# Patient Record
Sex: Female | Born: 1955 | Race: White | Hispanic: No | Marital: Married | Smoking: Never smoker
Health system: Southern US, Community
[De-identification: ages and names within clinical notes are randomized; demographics above are authoritative.]

## PROBLEM LIST (undated history)

## (undated) DIAGNOSIS — F0390 Unspecified dementia without behavioral disturbance: Secondary | ICD-10-CM

## (undated) DIAGNOSIS — C801 Malignant (primary) neoplasm, unspecified: Secondary | ICD-10-CM

## (undated) DIAGNOSIS — R519 Headache, unspecified: Secondary | ICD-10-CM

## (undated) DIAGNOSIS — F329 Major depressive disorder, single episode, unspecified: Secondary | ICD-10-CM

## (undated) DIAGNOSIS — F419 Anxiety disorder, unspecified: Secondary | ICD-10-CM

## (undated) DIAGNOSIS — F32A Depression, unspecified: Secondary | ICD-10-CM

## (undated) DIAGNOSIS — K219 Gastro-esophageal reflux disease without esophagitis: Secondary | ICD-10-CM

## (undated) DIAGNOSIS — R51 Headache: Secondary | ICD-10-CM

## (undated) DIAGNOSIS — F319 Bipolar disorder, unspecified: Secondary | ICD-10-CM

## (undated) DIAGNOSIS — I10 Essential (primary) hypertension: Secondary | ICD-10-CM

## (undated) HISTORY — PX: TUBAL LIGATION: SHX77

## (undated) HISTORY — PX: WRIST SURGERY: SHX841

## (undated) HISTORY — PX: OTHER SURGICAL HISTORY: SHX169

## (undated) HISTORY — PX: KNEE SURGERY: SHX244

---

## 1999-10-16 ENCOUNTER — Encounter: Payer: Self-pay | Admitting: Emergency Medicine

## 1999-10-16 ENCOUNTER — Emergency Department (HOSPITAL_COMMUNITY): Admission: EM | Admit: 1999-10-16 | Discharge: 1999-10-16 | Payer: Self-pay | Admitting: Emergency Medicine

## 2000-05-07 ENCOUNTER — Encounter: Payer: Self-pay | Admitting: Emergency Medicine

## 2000-05-07 ENCOUNTER — Emergency Department (HOSPITAL_COMMUNITY): Admission: EM | Admit: 2000-05-07 | Discharge: 2000-05-07 | Payer: Self-pay | Admitting: Emergency Medicine

## 2000-08-16 ENCOUNTER — Other Ambulatory Visit: Admission: RE | Admit: 2000-08-16 | Discharge: 2000-09-03 | Payer: Self-pay | Admitting: Psychiatry

## 2001-03-10 ENCOUNTER — Other Ambulatory Visit: Admission: RE | Admit: 2001-03-10 | Discharge: 2001-03-10 | Payer: Self-pay | Admitting: Gastroenterology

## 2001-03-10 ENCOUNTER — Encounter (INDEPENDENT_AMBULATORY_CARE_PROVIDER_SITE_OTHER): Payer: Self-pay | Admitting: Specialist

## 2001-11-01 ENCOUNTER — Emergency Department (HOSPITAL_COMMUNITY): Admission: EM | Admit: 2001-11-01 | Discharge: 2001-11-01 | Payer: Self-pay | Admitting: Emergency Medicine

## 2001-11-01 ENCOUNTER — Encounter: Payer: Self-pay | Admitting: Emergency Medicine

## 2002-07-21 ENCOUNTER — Encounter: Payer: Self-pay | Admitting: Pulmonary Disease

## 2002-07-21 ENCOUNTER — Ambulatory Visit (HOSPITAL_BASED_OUTPATIENT_CLINIC_OR_DEPARTMENT_OTHER): Admission: RE | Admit: 2002-07-21 | Discharge: 2002-07-21 | Payer: Self-pay | Admitting: Pulmonary Disease

## 2002-10-26 ENCOUNTER — Ambulatory Visit (HOSPITAL_BASED_OUTPATIENT_CLINIC_OR_DEPARTMENT_OTHER): Admission: RE | Admit: 2002-10-26 | Discharge: 2002-10-26 | Payer: Self-pay | Admitting: Pulmonary Disease

## 2002-10-26 ENCOUNTER — Encounter: Payer: Self-pay | Admitting: Pulmonary Disease

## 2002-12-28 ENCOUNTER — Encounter: Payer: Self-pay | Admitting: Emergency Medicine

## 2002-12-28 ENCOUNTER — Emergency Department (HOSPITAL_COMMUNITY): Admission: EM | Admit: 2002-12-28 | Discharge: 2002-12-29 | Payer: Self-pay | Admitting: Emergency Medicine

## 2003-02-05 ENCOUNTER — Encounter: Payer: Self-pay | Admitting: Pulmonary Disease

## 2003-02-19 ENCOUNTER — Encounter: Payer: Self-pay | Admitting: Pulmonary Disease

## 2003-02-22 ENCOUNTER — Encounter: Payer: Self-pay | Admitting: Pulmonary Disease

## 2003-02-22 ENCOUNTER — Encounter: Payer: Self-pay | Admitting: Gastroenterology

## 2003-02-22 ENCOUNTER — Ambulatory Visit (HOSPITAL_COMMUNITY): Admission: RE | Admit: 2003-02-22 | Discharge: 2003-02-22 | Payer: Self-pay | Admitting: Gastroenterology

## 2003-06-26 ENCOUNTER — Ambulatory Visit (HOSPITAL_COMMUNITY): Admission: RE | Admit: 2003-06-26 | Discharge: 2003-06-26 | Payer: Self-pay | Admitting: Gastroenterology

## 2004-01-29 ENCOUNTER — Other Ambulatory Visit (HOSPITAL_COMMUNITY): Admission: RE | Admit: 2004-01-29 | Discharge: 2004-02-04 | Payer: Self-pay | Admitting: Psychiatry

## 2004-02-14 ENCOUNTER — Emergency Department (HOSPITAL_COMMUNITY): Admission: AC | Admit: 2004-02-14 | Discharge: 2004-02-14 | Payer: Self-pay

## 2004-03-27 ENCOUNTER — Encounter: Admission: RE | Admit: 2004-03-27 | Discharge: 2004-03-27 | Payer: Self-pay | Admitting: Family Medicine

## 2004-06-24 ENCOUNTER — Encounter: Admission: RE | Admit: 2004-06-24 | Discharge: 2004-06-24 | Payer: Self-pay | Admitting: Internal Medicine

## 2004-10-20 ENCOUNTER — Encounter: Admission: RE | Admit: 2004-10-20 | Discharge: 2004-10-20 | Payer: Self-pay | Admitting: Obstetrics and Gynecology

## 2005-06-19 ENCOUNTER — Emergency Department (HOSPITAL_COMMUNITY): Admission: EM | Admit: 2005-06-19 | Discharge: 2005-06-19 | Payer: Self-pay | Admitting: Emergency Medicine

## 2005-11-17 ENCOUNTER — Encounter: Admission: RE | Admit: 2005-11-17 | Discharge: 2005-11-17 | Payer: Self-pay | Admitting: Obstetrics and Gynecology

## 2006-11-18 ENCOUNTER — Encounter: Admission: RE | Admit: 2006-11-18 | Discharge: 2006-11-18 | Payer: Self-pay | Admitting: Obstetrics and Gynecology

## 2007-11-22 ENCOUNTER — Encounter: Admission: RE | Admit: 2007-11-22 | Discharge: 2007-11-22 | Payer: Self-pay | Admitting: Obstetrics and Gynecology

## 2008-10-29 ENCOUNTER — Encounter: Payer: Self-pay | Admitting: Pulmonary Disease

## 2008-11-23 ENCOUNTER — Encounter: Admission: RE | Admit: 2008-11-23 | Discharge: 2008-11-23 | Payer: Self-pay | Admitting: Obstetrics and Gynecology

## 2008-11-26 ENCOUNTER — Encounter: Admission: RE | Admit: 2008-11-26 | Discharge: 2008-11-26 | Payer: Self-pay | Admitting: Internal Medicine

## 2008-11-26 ENCOUNTER — Encounter: Payer: Self-pay | Admitting: Pulmonary Disease

## 2009-01-15 ENCOUNTER — Ambulatory Visit: Payer: Self-pay | Admitting: Pulmonary Disease

## 2009-01-15 DIAGNOSIS — R05 Cough: Secondary | ICD-10-CM

## 2009-01-15 DIAGNOSIS — E785 Hyperlipidemia, unspecified: Secondary | ICD-10-CM | POA: Insufficient documentation

## 2009-01-15 DIAGNOSIS — R059 Cough, unspecified: Secondary | ICD-10-CM | POA: Insufficient documentation

## 2009-01-15 DIAGNOSIS — I10 Essential (primary) hypertension: Secondary | ICD-10-CM | POA: Insufficient documentation

## 2009-01-15 DIAGNOSIS — F329 Major depressive disorder, single episode, unspecified: Secondary | ICD-10-CM | POA: Insufficient documentation

## 2009-01-15 DIAGNOSIS — K219 Gastro-esophageal reflux disease without esophagitis: Secondary | ICD-10-CM | POA: Insufficient documentation

## 2009-01-15 DIAGNOSIS — G988 Other disorders of nervous system: Secondary | ICD-10-CM | POA: Insufficient documentation

## 2009-01-15 DIAGNOSIS — G4730 Sleep apnea, unspecified: Secondary | ICD-10-CM | POA: Insufficient documentation

## 2009-01-15 DIAGNOSIS — J309 Allergic rhinitis, unspecified: Secondary | ICD-10-CM | POA: Insufficient documentation

## 2009-01-15 DIAGNOSIS — F3289 Other specified depressive episodes: Secondary | ICD-10-CM | POA: Insufficient documentation

## 2009-02-01 ENCOUNTER — Ambulatory Visit: Payer: Self-pay | Admitting: Pulmonary Disease

## 2009-02-07 ENCOUNTER — Telehealth (INDEPENDENT_AMBULATORY_CARE_PROVIDER_SITE_OTHER): Payer: Self-pay | Admitting: *Deleted

## 2009-02-15 ENCOUNTER — Telehealth (INDEPENDENT_AMBULATORY_CARE_PROVIDER_SITE_OTHER): Payer: Self-pay | Admitting: *Deleted

## 2009-02-26 ENCOUNTER — Ambulatory Visit: Payer: Self-pay | Admitting: Pulmonary Disease

## 2009-03-15 ENCOUNTER — Telehealth (INDEPENDENT_AMBULATORY_CARE_PROVIDER_SITE_OTHER): Payer: Self-pay

## 2009-08-30 ENCOUNTER — Encounter: Payer: Self-pay | Admitting: Emergency Medicine

## 2009-08-30 ENCOUNTER — Inpatient Hospital Stay (HOSPITAL_COMMUNITY): Admission: EM | Admit: 2009-08-30 | Discharge: 2009-09-03 | Payer: Self-pay | Admitting: Internal Medicine

## 2009-08-30 ENCOUNTER — Ambulatory Visit: Payer: Self-pay | Admitting: Diagnostic Radiology

## 2009-08-30 ENCOUNTER — Encounter: Admission: RE | Admit: 2009-08-30 | Discharge: 2009-08-30 | Payer: Self-pay | Admitting: Internal Medicine

## 2009-09-27 ENCOUNTER — Telehealth: Payer: Self-pay | Admitting: Pulmonary Disease

## 2009-09-27 ENCOUNTER — Ambulatory Visit: Payer: Self-pay | Admitting: Pulmonary Disease

## 2009-09-27 DIAGNOSIS — J168 Pneumonia due to other specified infectious organisms: Secondary | ICD-10-CM | POA: Insufficient documentation

## 2009-12-17 ENCOUNTER — Encounter: Admission: RE | Admit: 2009-12-17 | Discharge: 2009-12-17 | Payer: Self-pay | Admitting: Obstetrics and Gynecology

## 2010-12-30 ENCOUNTER — Encounter
Admission: RE | Admit: 2010-12-30 | Discharge: 2010-12-30 | Payer: Self-pay | Source: Home / Self Care | Attending: Obstetrics and Gynecology | Admitting: Obstetrics and Gynecology

## 2011-03-04 ENCOUNTER — Encounter: Payer: Self-pay | Admitting: Gastroenterology

## 2011-03-10 NOTE — Letter (Signed)
Summary: Colonoscopy Letter  [facility]  [address] [place], [place] [address]   Phone: [phone]  Fax: [phone]      March 04, 2011 MRN: [id number]   [facility] [address] [place], [place]  [address]   Dear Lisa Jackson,   According to your medical record, it is time for you to schedule a Colonoscopy. The American Cancer Society recommends this procedure as a method to detect early colon cancer. Patients with a family history of colon cancer, or a personal history of colon polyps or inflammatory bowel disease are at increased risk.  This letter has been generated based on the recommendations made at the time of your procedure. If you feel that in your particular situation this may no longer apply, please contact our office.  Please call our office at [phone] to schedule this appointment or to update your records at your earliest convenience.  Thank you for cooperating with [place] to provide you with the very best care possible.   Sincerely,  Judie Petit T. Russella Dar, M.D.  [facility] [phone]

## 2011-03-27 LAB — CBC
HCT: 29.5 % — ABNORMAL LOW (ref 36.0–46.0)
HCT: 30.1 % — ABNORMAL LOW (ref 36.0–46.0)
Hemoglobin: 9.8 g/dL — ABNORMAL LOW (ref 12.0–15.0)
Hemoglobin: 9.9 g/dL — ABNORMAL LOW (ref 12.0–15.0)
Hemoglobin: 12.4 g/dL (ref 12.0–15.0)
MCHC: 32.7 g/dL (ref 30.0–36.0)
MCHC: 33.6 g/dL (ref 30.0–36.0)
MCV: 80.3 fL (ref 78.0–100.0)
MCV: 79.1 fL (ref 78.0–100.0)
RBC: 3.7 MIL/uL — ABNORMAL LOW (ref 3.87–5.11)
RBC: 3.74 MIL/uL — ABNORMAL LOW (ref 3.87–5.11)
RBC: 4.66 MIL/uL (ref 3.87–5.11)
RDW: 17.2 % — ABNORMAL HIGH (ref 11.5–15.5)
WBC: 24.3 10*3/uL — ABNORMAL HIGH (ref 4.0–10.5)

## 2011-03-27 LAB — URINE MICROSCOPIC-ADD ON

## 2011-03-27 LAB — DIFFERENTIAL
Basophils Absolute: 0 10*3/uL (ref 0.0–0.1)
Basophils Relative: 0 % (ref 0–1)
Eosinophils Absolute: 0.2 10*3/uL (ref 0.0–0.7)
Eosinophils Absolute: 0 10*3/uL (ref 0.0–0.7)
Eosinophils Relative: 1 % (ref 0–5)
Lymphocytes Relative: 15 % (ref 12–46)
Lymphocytes Relative: 24 % (ref 12–46)
Lymphocytes Relative: 6 % — ABNORMAL LOW (ref 12–46)
Lymphs Abs: 1.7 10*3/uL (ref 0.7–4.0)
Monocytes Absolute: 0.7 10*3/uL (ref 0.1–1.0)
Monocytes Relative: 6 % (ref 3–12)
Monocytes Relative: 5 % (ref 3–12)
Neutro Abs: 4.5 10*3/uL (ref 1.7–7.7)
Neutro Abs: 9.4 10*3/uL — ABNORMAL HIGH (ref 1.7–7.7)
Neutrophils Relative %: 64 % (ref 43–77)
Neutrophils Relative %: 89 % — ABNORMAL HIGH (ref 43–77)

## 2011-03-27 LAB — URINALYSIS, ROUTINE W REFLEX MICROSCOPIC
Glucose, UA: NEGATIVE mg/dL
Protein, ur: NEGATIVE mg/dL
Specific Gravity, Urine: 1.016 (ref 1.005–1.030)
Urobilinogen, UA: 0.2 mg/dL (ref 0.0–1.0)

## 2011-03-27 LAB — BASIC METABOLIC PANEL
CO2: 22 mEq/L (ref 19–32)
Calcium: 7.5 mg/dL — ABNORMAL LOW (ref 8.4–10.5)
GFR calc Af Amer: >60 mL/min (ref >60)
GFR calc non Af Amer: >60 mL/min (ref >60)
Glucose, Bld: 122 mg/dL — ABNORMAL HIGH (ref 70–99)
Potassium: 3.5 mEq/L (ref 3.5–5.1)
Sodium: 136 mEq/L (ref 135–145)

## 2011-03-27 LAB — COMPREHENSIVE METABOLIC PANEL
ALT: 10 U/L (ref 0–35)
Albumin: 3.6 g/dL (ref 3.5–5.2)
Alkaline Phosphatase: 99 U/L (ref 39–117)
BUN: 2 mg/dL — ABNORMAL LOW (ref 6–23)
BUN: 12 mg/dL (ref 6–23)
CO2: 24 mEq/L (ref 19–32)
Calcium: 8.2 mg/dL — ABNORMAL LOW (ref 8.4–10.5)
Creatinine, Ser: 0.63 mg/dL (ref 0.4–1.2)
Creatinine, Ser: 1.1 mg/dL (ref 0.4–1.2)
GFR calc non Af Amer: >60 mL/min (ref >60)
Glucose, Bld: 121 mg/dL — ABNORMAL HIGH (ref 70–99)
Glucose, Bld: 111 mg/dL — ABNORMAL HIGH (ref 70–99)
Potassium: 4 mEq/L (ref 3.5–5.1)
Total Protein: 5 g/dL — ABNORMAL LOW (ref 6.0–8.3)
Total Protein: 6.6 g/dL (ref 6.0–8.3)

## 2011-03-27 LAB — CULTURE, BLOOD (ROUTINE X 2)
Culture: NO GROWTH
Culture: NO GROWTH

## 2011-12-18 ENCOUNTER — Other Ambulatory Visit: Payer: Self-pay | Admitting: Dermatology

## 2012-01-01 DIAGNOSIS — F331 Major depressive disorder, recurrent, moderate: Secondary | ICD-10-CM | POA: Diagnosis not present

## 2012-01-07 DIAGNOSIS — R404 Transient alteration of awareness: Secondary | ICD-10-CM | POA: Diagnosis not present

## 2012-01-07 DIAGNOSIS — F0390 Unspecified dementia without behavioral disturbance: Secondary | ICD-10-CM | POA: Diagnosis not present

## 2012-01-20 DIAGNOSIS — N3941 Urge incontinence: Secondary | ICD-10-CM | POA: Diagnosis not present

## 2012-01-22 ENCOUNTER — Other Ambulatory Visit: Payer: Self-pay | Admitting: Obstetrics and Gynecology

## 2012-01-22 DIAGNOSIS — Z1231 Encounter for screening mammogram for malignant neoplasm of breast: Secondary | ICD-10-CM

## 2012-01-26 ENCOUNTER — Other Ambulatory Visit: Payer: Self-pay | Admitting: Obstetrics and Gynecology

## 2012-01-26 DIAGNOSIS — Z1231 Encounter for screening mammogram for malignant neoplasm of breast: Secondary | ICD-10-CM

## 2012-02-06 DIAGNOSIS — R404 Transient alteration of awareness: Secondary | ICD-10-CM | POA: Diagnosis not present

## 2012-02-06 DIAGNOSIS — F0390 Unspecified dementia without behavioral disturbance: Secondary | ICD-10-CM | POA: Diagnosis not present

## 2012-02-07 DIAGNOSIS — R404 Transient alteration of awareness: Secondary | ICD-10-CM | POA: Diagnosis not present

## 2012-02-07 DIAGNOSIS — F0390 Unspecified dementia without behavioral disturbance: Secondary | ICD-10-CM | POA: Diagnosis not present

## 2012-02-08 DIAGNOSIS — R404 Transient alteration of awareness: Secondary | ICD-10-CM | POA: Diagnosis not present

## 2012-02-08 DIAGNOSIS — F0390 Unspecified dementia without behavioral disturbance: Secondary | ICD-10-CM | POA: Diagnosis not present

## 2012-02-11 DIAGNOSIS — I10 Essential (primary) hypertension: Secondary | ICD-10-CM | POA: Diagnosis not present

## 2012-02-11 DIAGNOSIS — K219 Gastro-esophageal reflux disease without esophagitis: Secondary | ICD-10-CM | POA: Diagnosis not present

## 2012-02-11 DIAGNOSIS — G43909 Migraine, unspecified, not intractable, without status migrainosus: Secondary | ICD-10-CM | POA: Diagnosis not present

## 2012-02-11 DIAGNOSIS — F0390 Unspecified dementia without behavioral disturbance: Secondary | ICD-10-CM | POA: Diagnosis not present

## 2012-02-11 DIAGNOSIS — G259 Extrapyramidal and movement disorder, unspecified: Secondary | ICD-10-CM | POA: Diagnosis not present

## 2012-02-11 DIAGNOSIS — E782 Mixed hyperlipidemia: Secondary | ICD-10-CM | POA: Diagnosis not present

## 2012-02-11 DIAGNOSIS — F329 Major depressive disorder, single episode, unspecified: Secondary | ICD-10-CM | POA: Diagnosis not present

## 2012-02-12 DIAGNOSIS — F331 Major depressive disorder, recurrent, moderate: Secondary | ICD-10-CM | POA: Diagnosis not present

## 2012-02-16 ENCOUNTER — Ambulatory Visit
Admission: RE | Admit: 2012-02-16 | Discharge: 2012-02-16 | Disposition: A | Discharge status: Home / Self Care | Payer: Self-pay | Source: Ambulatory Visit | Attending: Obstetrics and Gynecology | Admitting: Obstetrics and Gynecology

## 2012-02-16 DIAGNOSIS — Z1231 Encounter for screening mammogram for malignant neoplasm of breast: Secondary | ICD-10-CM

## 2012-03-02 DIAGNOSIS — N3941 Urge incontinence: Secondary | ICD-10-CM | POA: Diagnosis not present

## 2012-03-24 DIAGNOSIS — N952 Postmenopausal atrophic vaginitis: Secondary | ICD-10-CM | POA: Diagnosis not present

## 2012-03-24 DIAGNOSIS — Z124 Encounter for screening for malignant neoplasm of cervix: Secondary | ICD-10-CM | POA: Diagnosis not present

## 2012-03-24 DIAGNOSIS — Z780 Asymptomatic menopausal state: Secondary | ICD-10-CM | POA: Diagnosis not present

## 2012-03-24 DIAGNOSIS — Z01419 Encounter for gynecological examination (general) (routine) without abnormal findings: Secondary | ICD-10-CM | POA: Diagnosis not present

## 2012-03-25 DIAGNOSIS — F331 Major depressive disorder, recurrent, moderate: Secondary | ICD-10-CM | POA: Diagnosis not present

## 2012-03-30 DIAGNOSIS — N3941 Urge incontinence: Secondary | ICD-10-CM | POA: Diagnosis not present

## 2012-03-31 DIAGNOSIS — H532 Diplopia: Secondary | ICD-10-CM | POA: Diagnosis not present

## 2012-03-31 DIAGNOSIS — H5000 Unspecified esotropia: Secondary | ICD-10-CM | POA: Diagnosis not present

## 2012-03-31 DIAGNOSIS — H539 Unspecified visual disturbance: Secondary | ICD-10-CM | POA: Diagnosis not present

## 2012-03-31 DIAGNOSIS — H53129 Transient visual loss, unspecified eye: Secondary | ICD-10-CM | POA: Diagnosis not present

## 2012-05-05 DIAGNOSIS — B977 Papillomavirus as the cause of diseases classified elsewhere: Secondary | ICD-10-CM | POA: Diagnosis not present

## 2012-05-05 DIAGNOSIS — R87810 Cervical high risk human papillomavirus (HPV) DNA test positive: Secondary | ICD-10-CM | POA: Diagnosis not present

## 2012-05-06 DIAGNOSIS — F331 Major depressive disorder, recurrent, moderate: Secondary | ICD-10-CM | POA: Diagnosis not present

## 2012-05-11 DIAGNOSIS — N3941 Urge incontinence: Secondary | ICD-10-CM | POA: Diagnosis not present

## 2012-05-24 DIAGNOSIS — R413 Other amnesia: Secondary | ICD-10-CM | POA: Diagnosis not present

## 2012-05-24 DIAGNOSIS — G255 Other chorea: Secondary | ICD-10-CM | POA: Diagnosis not present

## 2012-05-25 DIAGNOSIS — IMO0001 Reserved for inherently not codable concepts without codable children: Secondary | ICD-10-CM | POA: Diagnosis not present

## 2012-05-25 DIAGNOSIS — M25569 Pain in unspecified knee: Secondary | ICD-10-CM | POA: Diagnosis not present

## 2012-05-25 DIAGNOSIS — M76899 Other specified enthesopathies of unspecified lower limb, excluding foot: Secondary | ICD-10-CM | POA: Diagnosis not present

## 2012-06-07 DIAGNOSIS — B009 Herpesviral infection, unspecified: Secondary | ICD-10-CM | POA: Diagnosis not present

## 2012-06-07 DIAGNOSIS — A6004 Herpesviral vulvovaginitis: Secondary | ICD-10-CM | POA: Diagnosis not present

## 2012-06-15 DIAGNOSIS — N3941 Urge incontinence: Secondary | ICD-10-CM | POA: Diagnosis not present

## 2012-06-17 DIAGNOSIS — F331 Major depressive disorder, recurrent, moderate: Secondary | ICD-10-CM | POA: Diagnosis not present

## 2012-07-12 DIAGNOSIS — N3941 Urge incontinence: Secondary | ICD-10-CM | POA: Diagnosis not present

## 2012-07-12 DIAGNOSIS — N318 Other neuromuscular dysfunction of bladder: Secondary | ICD-10-CM | POA: Diagnosis not present

## 2012-07-20 DIAGNOSIS — N3941 Urge incontinence: Secondary | ICD-10-CM | POA: Diagnosis not present

## 2012-08-11 DIAGNOSIS — K219 Gastro-esophageal reflux disease without esophagitis: Secondary | ICD-10-CM | POA: Diagnosis not present

## 2012-08-11 DIAGNOSIS — I10 Essential (primary) hypertension: Secondary | ICD-10-CM | POA: Diagnosis not present

## 2012-08-11 DIAGNOSIS — J3489 Other specified disorders of nose and nasal sinuses: Secondary | ICD-10-CM | POA: Diagnosis not present

## 2012-08-11 DIAGNOSIS — M810 Age-related osteoporosis without current pathological fracture: Secondary | ICD-10-CM | POA: Diagnosis not present

## 2012-08-11 DIAGNOSIS — E782 Mixed hyperlipidemia: Secondary | ICD-10-CM | POA: Diagnosis not present

## 2012-08-19 DIAGNOSIS — F331 Major depressive disorder, recurrent, moderate: Secondary | ICD-10-CM | POA: Diagnosis not present

## 2012-08-24 DIAGNOSIS — N3941 Urge incontinence: Secondary | ICD-10-CM | POA: Diagnosis not present

## 2012-09-07 ENCOUNTER — Encounter: Payer: Self-pay | Admitting: Gastroenterology

## 2012-09-19 DIAGNOSIS — L570 Actinic keratosis: Secondary | ICD-10-CM | POA: Diagnosis not present

## 2012-09-19 DIAGNOSIS — L578 Other skin changes due to chronic exposure to nonionizing radiation: Secondary | ICD-10-CM | POA: Diagnosis not present

## 2012-09-19 DIAGNOSIS — Z85828 Personal history of other malignant neoplasm of skin: Secondary | ICD-10-CM | POA: Diagnosis not present

## 2012-09-19 DIAGNOSIS — D239 Other benign neoplasm of skin, unspecified: Secondary | ICD-10-CM | POA: Diagnosis not present

## 2012-09-19 DIAGNOSIS — L821 Other seborrheic keratosis: Secondary | ICD-10-CM | POA: Diagnosis not present

## 2012-09-28 DIAGNOSIS — M810 Age-related osteoporosis without current pathological fracture: Secondary | ICD-10-CM | POA: Diagnosis not present

## 2012-09-28 DIAGNOSIS — N3941 Urge incontinence: Secondary | ICD-10-CM | POA: Diagnosis not present

## 2012-10-14 DIAGNOSIS — F331 Major depressive disorder, recurrent, moderate: Secondary | ICD-10-CM | POA: Diagnosis not present

## 2012-10-31 DIAGNOSIS — G255 Other chorea: Secondary | ICD-10-CM | POA: Diagnosis not present

## 2012-10-31 DIAGNOSIS — F411 Generalized anxiety disorder: Secondary | ICD-10-CM | POA: Diagnosis not present

## 2012-10-31 DIAGNOSIS — F3289 Other specified depressive episodes: Secondary | ICD-10-CM | POA: Diagnosis not present

## 2012-10-31 DIAGNOSIS — F329 Major depressive disorder, single episode, unspecified: Secondary | ICD-10-CM | POA: Diagnosis not present

## 2012-11-01 DIAGNOSIS — N3941 Urge incontinence: Secondary | ICD-10-CM | POA: Diagnosis not present

## 2012-11-02 DIAGNOSIS — Z23 Encounter for immunization: Secondary | ICD-10-CM | POA: Diagnosis not present

## 2012-11-25 DIAGNOSIS — F331 Major depressive disorder, recurrent, moderate: Secondary | ICD-10-CM | POA: Diagnosis not present

## 2012-11-25 DIAGNOSIS — L259 Unspecified contact dermatitis, unspecified cause: Secondary | ICD-10-CM | POA: Diagnosis not present

## 2012-12-07 DIAGNOSIS — N3941 Urge incontinence: Secondary | ICD-10-CM | POA: Diagnosis not present

## 2013-01-05 DIAGNOSIS — R059 Cough, unspecified: Secondary | ICD-10-CM | POA: Diagnosis not present

## 2013-01-05 DIAGNOSIS — J40 Bronchitis, not specified as acute or chronic: Secondary | ICD-10-CM | POA: Diagnosis not present

## 2013-01-05 DIAGNOSIS — J329 Chronic sinusitis, unspecified: Secondary | ICD-10-CM | POA: Diagnosis not present

## 2013-01-05 DIAGNOSIS — R05 Cough: Secondary | ICD-10-CM | POA: Diagnosis not present

## 2013-01-10 DIAGNOSIS — R413 Other amnesia: Secondary | ICD-10-CM | POA: Diagnosis not present

## 2013-01-10 DIAGNOSIS — G255 Other chorea: Secondary | ICD-10-CM | POA: Diagnosis not present

## 2013-01-11 DIAGNOSIS — N3941 Urge incontinence: Secondary | ICD-10-CM | POA: Diagnosis not present

## 2013-01-12 ENCOUNTER — Other Ambulatory Visit: Payer: Self-pay | Admitting: Obstetrics and Gynecology

## 2013-01-12 DIAGNOSIS — Z1231 Encounter for screening mammogram for malignant neoplasm of breast: Secondary | ICD-10-CM

## 2013-01-13 DIAGNOSIS — F331 Major depressive disorder, recurrent, moderate: Secondary | ICD-10-CM | POA: Diagnosis not present

## 2013-02-17 DIAGNOSIS — F331 Major depressive disorder, recurrent, moderate: Secondary | ICD-10-CM | POA: Diagnosis not present

## 2013-02-21 ENCOUNTER — Ambulatory Visit: Payer: Federal, State, Local not specified - PPO

## 2013-02-27 DIAGNOSIS — M7750 Other enthesopathy of unspecified foot: Secondary | ICD-10-CM | POA: Diagnosis not present

## 2013-02-28 ENCOUNTER — Ambulatory Visit: Payer: Federal, State, Local not specified - PPO

## 2013-03-08 DIAGNOSIS — N3941 Urge incontinence: Secondary | ICD-10-CM | POA: Diagnosis not present

## 2013-03-09 ENCOUNTER — Ambulatory Visit (INDEPENDENT_AMBULATORY_CARE_PROVIDER_SITE_OTHER): Payer: Federal, State, Local not specified - PPO

## 2013-03-09 DIAGNOSIS — Z1231 Encounter for screening mammogram for malignant neoplasm of breast: Secondary | ICD-10-CM | POA: Diagnosis not present

## 2013-03-10 DIAGNOSIS — F331 Major depressive disorder, recurrent, moderate: Secondary | ICD-10-CM | POA: Diagnosis not present

## 2013-03-15 DIAGNOSIS — M7750 Other enthesopathy of unspecified foot: Secondary | ICD-10-CM | POA: Diagnosis not present

## 2013-03-23 DIAGNOSIS — R509 Fever, unspecified: Secondary | ICD-10-CM | POA: Diagnosis not present

## 2013-03-23 DIAGNOSIS — J029 Acute pharyngitis, unspecified: Secondary | ICD-10-CM | POA: Diagnosis not present

## 2013-03-23 DIAGNOSIS — J329 Chronic sinusitis, unspecified: Secondary | ICD-10-CM | POA: Diagnosis not present

## 2013-04-05 DIAGNOSIS — F331 Major depressive disorder, recurrent, moderate: Secondary | ICD-10-CM | POA: Diagnosis not present

## 2013-04-12 DIAGNOSIS — N3941 Urge incontinence: Secondary | ICD-10-CM | POA: Diagnosis not present

## 2013-04-24 DIAGNOSIS — R21 Rash and other nonspecific skin eruption: Secondary | ICD-10-CM | POA: Diagnosis not present

## 2013-04-24 DIAGNOSIS — E782 Mixed hyperlipidemia: Secondary | ICD-10-CM | POA: Diagnosis not present

## 2013-04-24 DIAGNOSIS — I10 Essential (primary) hypertension: Secondary | ICD-10-CM | POA: Diagnosis not present

## 2013-04-24 DIAGNOSIS — K219 Gastro-esophageal reflux disease without esophagitis: Secondary | ICD-10-CM | POA: Diagnosis not present

## 2013-04-24 DIAGNOSIS — F329 Major depressive disorder, single episode, unspecified: Secondary | ICD-10-CM | POA: Diagnosis not present

## 2013-04-24 DIAGNOSIS — G259 Extrapyramidal and movement disorder, unspecified: Secondary | ICD-10-CM | POA: Diagnosis not present

## 2013-04-28 DIAGNOSIS — F331 Major depressive disorder, recurrent, moderate: Secondary | ICD-10-CM | POA: Diagnosis not present

## 2013-05-17 DIAGNOSIS — N3941 Urge incontinence: Secondary | ICD-10-CM | POA: Diagnosis not present

## 2013-05-19 DIAGNOSIS — F331 Major depressive disorder, recurrent, moderate: Secondary | ICD-10-CM | POA: Diagnosis not present

## 2013-06-07 DIAGNOSIS — F331 Major depressive disorder, recurrent, moderate: Secondary | ICD-10-CM | POA: Diagnosis not present

## 2013-06-08 DIAGNOSIS — IMO0001 Reserved for inherently not codable concepts without codable children: Secondary | ICD-10-CM | POA: Diagnosis not present

## 2013-06-08 DIAGNOSIS — M25569 Pain in unspecified knee: Secondary | ICD-10-CM | POA: Diagnosis not present

## 2013-06-08 DIAGNOSIS — M25559 Pain in unspecified hip: Secondary | ICD-10-CM | POA: Diagnosis not present

## 2013-06-08 DIAGNOSIS — R5381 Other malaise: Secondary | ICD-10-CM | POA: Diagnosis not present

## 2013-06-08 DIAGNOSIS — R5383 Other fatigue: Secondary | ICD-10-CM | POA: Diagnosis not present

## 2013-06-30 DIAGNOSIS — B977 Papillomavirus as the cause of diseases classified elsewhere: Secondary | ICD-10-CM | POA: Diagnosis not present

## 2013-06-30 DIAGNOSIS — N952 Postmenopausal atrophic vaginitis: Secondary | ICD-10-CM | POA: Diagnosis not present

## 2013-06-30 DIAGNOSIS — Z01419 Encounter for gynecological examination (general) (routine) without abnormal findings: Secondary | ICD-10-CM | POA: Diagnosis not present

## 2013-06-30 DIAGNOSIS — Z780 Asymptomatic menopausal state: Secondary | ICD-10-CM | POA: Diagnosis not present

## 2013-06-30 DIAGNOSIS — Z124 Encounter for screening for malignant neoplasm of cervix: Secondary | ICD-10-CM | POA: Diagnosis not present

## 2013-06-30 DIAGNOSIS — R87810 Cervical high risk human papillomavirus (HPV) DNA test positive: Secondary | ICD-10-CM | POA: Diagnosis not present

## 2013-07-07 DIAGNOSIS — F331 Major depressive disorder, recurrent, moderate: Secondary | ICD-10-CM | POA: Diagnosis not present

## 2013-07-12 DIAGNOSIS — N3941 Urge incontinence: Secondary | ICD-10-CM | POA: Diagnosis not present

## 2013-07-17 DIAGNOSIS — N318 Other neuromuscular dysfunction of bladder: Secondary | ICD-10-CM | POA: Diagnosis not present

## 2013-07-21 DIAGNOSIS — F331 Major depressive disorder, recurrent, moderate: Secondary | ICD-10-CM | POA: Diagnosis not present

## 2013-08-11 DIAGNOSIS — F331 Major depressive disorder, recurrent, moderate: Secondary | ICD-10-CM | POA: Diagnosis not present

## 2013-08-16 DIAGNOSIS — N3941 Urge incontinence: Secondary | ICD-10-CM | POA: Diagnosis not present

## 2013-09-01 DIAGNOSIS — F331 Major depressive disorder, recurrent, moderate: Secondary | ICD-10-CM | POA: Diagnosis not present

## 2013-09-19 DIAGNOSIS — L570 Actinic keratosis: Secondary | ICD-10-CM | POA: Diagnosis not present

## 2013-09-19 DIAGNOSIS — L821 Other seborrheic keratosis: Secondary | ICD-10-CM | POA: Diagnosis not present

## 2013-09-19 DIAGNOSIS — Z85828 Personal history of other malignant neoplasm of skin: Secondary | ICD-10-CM | POA: Diagnosis not present

## 2013-09-19 DIAGNOSIS — L408 Other psoriasis: Secondary | ICD-10-CM | POA: Diagnosis not present

## 2013-09-19 DIAGNOSIS — D239 Other benign neoplasm of skin, unspecified: Secondary | ICD-10-CM | POA: Diagnosis not present

## 2013-09-20 DIAGNOSIS — N3941 Urge incontinence: Secondary | ICD-10-CM | POA: Diagnosis not present

## 2013-09-22 DIAGNOSIS — F331 Major depressive disorder, recurrent, moderate: Secondary | ICD-10-CM | POA: Diagnosis not present

## 2013-10-03 DIAGNOSIS — Z23 Encounter for immunization: Secondary | ICD-10-CM | POA: Diagnosis not present

## 2013-10-03 DIAGNOSIS — R279 Unspecified lack of coordination: Secondary | ICD-10-CM | POA: Diagnosis not present

## 2013-10-03 DIAGNOSIS — R079 Chest pain, unspecified: Secondary | ICD-10-CM | POA: Diagnosis not present

## 2013-10-03 DIAGNOSIS — Z0000 Encounter for general adult medical examination without abnormal findings: Secondary | ICD-10-CM | POA: Diagnosis not present

## 2013-10-03 DIAGNOSIS — R42 Dizziness and giddiness: Secondary | ICD-10-CM | POA: Diagnosis not present

## 2013-10-10 DIAGNOSIS — M76899 Other specified enthesopathies of unspecified lower limb, excluding foot: Secondary | ICD-10-CM | POA: Diagnosis not present

## 2013-10-20 DIAGNOSIS — F331 Major depressive disorder, recurrent, moderate: Secondary | ICD-10-CM | POA: Diagnosis not present

## 2013-10-23 DIAGNOSIS — K219 Gastro-esophageal reflux disease without esophagitis: Secondary | ICD-10-CM | POA: Diagnosis not present

## 2013-10-23 DIAGNOSIS — F329 Major depressive disorder, single episode, unspecified: Secondary | ICD-10-CM | POA: Diagnosis not present

## 2013-10-23 DIAGNOSIS — I10 Essential (primary) hypertension: Secondary | ICD-10-CM | POA: Diagnosis not present

## 2013-10-23 DIAGNOSIS — E782 Mixed hyperlipidemia: Secondary | ICD-10-CM | POA: Diagnosis not present

## 2013-10-25 DIAGNOSIS — N3941 Urge incontinence: Secondary | ICD-10-CM | POA: Diagnosis not present

## 2013-11-08 DIAGNOSIS — G2581 Restless legs syndrome: Secondary | ICD-10-CM | POA: Diagnosis not present

## 2013-11-29 DIAGNOSIS — N3941 Urge incontinence: Secondary | ICD-10-CM | POA: Diagnosis not present

## 2013-11-30 DIAGNOSIS — M2140 Flat foot [pes planus] (acquired), unspecified foot: Secondary | ICD-10-CM | POA: Diagnosis not present

## 2013-11-30 DIAGNOSIS — M2240 Chondromalacia patellae, unspecified knee: Secondary | ICD-10-CM | POA: Diagnosis not present

## 2013-12-01 DIAGNOSIS — F331 Major depressive disorder, recurrent, moderate: Secondary | ICD-10-CM | POA: Diagnosis not present

## 2013-12-04 ENCOUNTER — Encounter: Payer: Self-pay | Admitting: Sports Medicine

## 2013-12-04 ENCOUNTER — Ambulatory Visit (INDEPENDENT_AMBULATORY_CARE_PROVIDER_SITE_OTHER): Payer: Medicare Other | Admitting: Sports Medicine

## 2013-12-04 VITALS — BP 121/78 | HR 72 | Ht 61.0 in | Wt 116.0 lb

## 2013-12-04 DIAGNOSIS — M25561 Pain in right knee: Secondary | ICD-10-CM

## 2013-12-04 DIAGNOSIS — M25569 Pain in unspecified knee: Secondary | ICD-10-CM

## 2013-12-04 DIAGNOSIS — M216X9 Other acquired deformities of unspecified foot: Secondary | ICD-10-CM

## 2013-12-04 DIAGNOSIS — Q667 Congenital pes cavus, unspecified foot: Secondary | ICD-10-CM

## 2013-12-04 MED ORDER — METHYLPREDNISOLONE ACETATE 40 MG/ML IJ SUSP
40.0000 mg | Freq: Once | INTRAMUSCULAR | Status: AC
  Administered 2013-12-04: 40 mg via INTRA_ARTICULAR

## 2013-12-05 NOTE — Progress Notes (Signed)
   Subjective:    Patient ID: Lisa Jackson, female    DOB: 19-Apr-1956, 57 y.o.   MRN: [id number]  HPI chief complaint: Bilateral knee pain  Very pleasant 57 year old female comes in today at the request of Dr. Thurston Hole. She is referred by Dr. Thurston Hole for gait analysis and possible orthotics. Patient's main complaint is diffuse bilateral knee pain. She has a history of chondromalacia patella and a remote history of a left knee arthroscopy done many years ago. She is currently training for a 10K which she is planning on running in [place] in 3 weeks. Her knee pain is diffuse and worse with running. She has had chronic foot pain most of her life. She has tried several orthotics. Most recent pair orthotics were constructed back in March. Her main foot pain is along the metatarsal heads. She has not noticed any swelling. She is here today with her husband.  Past medical history and current medications are reviewed She is allergic to sulfa drugs She denies tobacco use, she is retired    Review of Systems     Objective:   Physical Exam Well-developed, well-nourished. No acute distress. Awake alert and oriented x3.  Examination of each knee shows full range of motion. No effusion. She has quad and VMO weakness bilaterally. Mild patellofemoral crepitus. Good ligamentous stability.  Examination of both feet show a cavus foot with significant forefoot varus, mild hindfoot varus, and splaying of the first, second, and third toes. Mild tenderness to palpation along the metatarsal heads but not markedly. No significant callus buildup.  Examination of her running form shows a high stepping gait with dynamic genu valgus. No obvious limp.      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 Unfortunately she just recently had a new pair constructed in  March and I'm concerned that her insurance will not pay for a new pair of custom orthotics until next year. I am going to add a metatarsal pad to her current orthotics and I have shown her some hip and knee strengthening exercises. Since her main complaint is bilateral knee pain and she has a history of chondromalacia patella I have elected to inject each of her knees today with cortisone in hopes that this will help calm her symptoms down so that she can complete her 10K in 3 weeks. She will followup with me in 4 weeks for reevaluation. She will check with her insurance in the interim to see if they will cover new orthotics.  Consent obtained and verified. Time-out conducted. Noted no overlying erythema, induration, or other signs of local infection. Skin prepped in a sterile fashion. Topical analgesic spray: Ethyl chloride. Joint: right knee, anterior lateral approach Needle:25g 1.5 inch Completed without difficulty. Meds: 3cc 1% xylocaine, 1cc (40mg ) depomedrol  Consent obtained and verified. Time-out conducted. Noted no overlying erythema, induration, or other signs of local infection. Skin prepped in a sterile fashion. Topical analgesic spray: Ethyl chloride. Joint: left knee, anterior lateral approach Needle: 25g 1.5 inch Completed without difficulty. Meds: 3cc 1% xylocaine, 1cc (40mg ) depomedrol  Advised to call if fevers/chills, erythema, induration, drainage, or persistent bleeding.   Advised to call if fevers/chills, erythema, induration, drainage, or persistent bleeding.

## 2014-01-03 ENCOUNTER — Encounter: Payer: Self-pay | Admitting: Sports Medicine

## 2014-01-03 ENCOUNTER — Ambulatory Visit (INDEPENDENT_AMBULATORY_CARE_PROVIDER_SITE_OTHER): Payer: Medicare Other | Admitting: Sports Medicine

## 2014-01-03 VITALS — BP 115/76 | HR 87 | Ht 61.0 in | Wt 115.0 lb

## 2014-01-03 DIAGNOSIS — M25569 Pain in unspecified knee: Secondary | ICD-10-CM | POA: Diagnosis not present

## 2014-01-03 DIAGNOSIS — M216X9 Other acquired deformities of unspecified foot: Secondary | ICD-10-CM

## 2014-01-03 DIAGNOSIS — Q667 Congenital pes cavus, unspecified foot: Secondary | ICD-10-CM

## 2014-01-03 DIAGNOSIS — M25561 Pain in right knee: Secondary | ICD-10-CM

## 2014-01-03 DIAGNOSIS — M25562 Pain in left knee: Principal | ICD-10-CM

## 2014-01-03 NOTE — Progress Notes (Signed)
Patient ID: Lisa Jackson, female   DOB: 21-Jan-1956, 58 y.o.   MRN: [id number]  Patient comes in today for orthotics construction. She was able to run her race at [employer] and is very happy about completing it. She's not sure whether or not the cortisone injections in her knees prior to the race were helpful. She has had custom orthotics made in the past but they are rigid orthotics. She would like a semirigid pair for athletics. Please see previous office note for details regarding history and physical exam.  Patient was fitted for a : standard, cushioned, semi-rigid orthotic. The orthotic was heated and afterward the patient stood on the orthotic blank positioned on the orthotic stand. The patient was positioned in subtalar neutral position and 10 degrees of ankle dorsiflexion in a weight bearing stance. After completion of molding, a stable base was applied to the orthotic blank. The blank was ground to a stable position for weight bearing. Size: men's 7 Base: blue EVA Posting: none Additional orthotic padding: b/l metatarsal pads  A total of 30 minutes was spent with the patient with greater than 50% of the time spent in face to face consultation regarding orthotic fitting and construction. Patient found orthotics to be comfortable.  Follow up in 3-4 weeks for a recheck or sooner if she has problems.

## 2014-01-12 DIAGNOSIS — F331 Major depressive disorder, recurrent, moderate: Secondary | ICD-10-CM | POA: Diagnosis not present

## 2014-01-17 DIAGNOSIS — N3941 Urge incontinence: Secondary | ICD-10-CM | POA: Diagnosis not present

## 2014-01-30 ENCOUNTER — Other Ambulatory Visit: Payer: Self-pay | Admitting: Obstetrics and Gynecology

## 2014-01-30 DIAGNOSIS — Z1231 Encounter for screening mammogram for malignant neoplasm of breast: Secondary | ICD-10-CM

## 2014-02-05 ENCOUNTER — Encounter: Payer: Self-pay | Admitting: Sports Medicine

## 2014-02-05 ENCOUNTER — Ambulatory Visit (INDEPENDENT_AMBULATORY_CARE_PROVIDER_SITE_OTHER): Payer: Medicare Other | Admitting: Sports Medicine

## 2014-02-05 VITALS — BP 128/94 | HR 75 | Ht 61.0 in | Wt 115.0 lb

## 2014-02-05 DIAGNOSIS — M25569 Pain in unspecified knee: Secondary | ICD-10-CM | POA: Diagnosis not present

## 2014-02-05 DIAGNOSIS — M216X9 Other acquired deformities of unspecified foot: Secondary | ICD-10-CM

## 2014-02-05 DIAGNOSIS — M25561 Pain in right knee: Secondary | ICD-10-CM

## 2014-02-05 DIAGNOSIS — Q667 Congenital pes cavus, unspecified foot: Secondary | ICD-10-CM

## 2014-02-05 DIAGNOSIS — M25562 Pain in left knee: Principal | ICD-10-CM

## 2014-02-05 NOTE — Patient Instructions (Signed)
Thank you for coming in today You are doing fantastic Please continue to wear your metatarsal pads with your shoes Please feel free to come back as needed for other concerns.

## 2014-02-05 NOTE — Progress Notes (Signed)
Lisa Jackson is a 58 y.o. female who presents to [facility] today for f/u for knee pain and cavus deformation  Knee pain and foot pain markedly reduced. Wearing thwe orthotics adn metatarsal pads on shoes. Ran in 10K race recently and did very well. Would like further pads for other shoes. No oral medications. Continues to play pickle ball and aquafit, and aqua zoomba several days a week.   Overall pt is unsure if the injections were helpful or if the metatarsal pads did more for her overall pain. Pt is very happy w/ her current condition   PMH reviewed.  ROS as above otherwise neg Medications reviewed.  Exam:  BP 128/94  Pulse 75  Ht 5\' 1"  (1.549 m)  Wt 115 lb (52.164 kg)  BMI 21.74 kg/m2 Gen: Well NAD MSK: feet bilat w/ FROM and non-ttp. No effusion/swelling. Wide spacing for the 1st-2nd toes. High arch. No ankle instability Knees bilat w/ FROM. Non-ttp, no effusions, mild crepitus w/ R>L  No results found.   Assessment and Plan: 1) bilateral chondromalacia: stable and w/o symptoms at this time. Pt is aproximately 2 mo out from cortisone injection. Pt w/ orthotics w/ some modifications over this time period and w/o pain. Continue w/ exercise routine and orthotics. Return PRN  2) Cavus foot w/ forefoot abduction: no further pain after insertion of metatarsal pad insertion into running shoes. Pts second pair of running shoes fitted w/ metatarsal pads today. Return PRN  Linna Darner, MD Family Medicine PGY-3 02/05/2014, 9:06 AM

## 2014-02-09 DIAGNOSIS — F331 Major depressive disorder, recurrent, moderate: Secondary | ICD-10-CM | POA: Diagnosis not present

## 2014-02-21 DIAGNOSIS — N3941 Urge incontinence: Secondary | ICD-10-CM | POA: Diagnosis not present

## 2014-03-15 ENCOUNTER — Ambulatory Visit (INDEPENDENT_AMBULATORY_CARE_PROVIDER_SITE_OTHER): Payer: Medicare Other

## 2014-03-15 DIAGNOSIS — Z1231 Encounter for screening mammogram for malignant neoplasm of breast: Secondary | ICD-10-CM | POA: Diagnosis not present

## 2014-03-28 DIAGNOSIS — N3941 Urge incontinence: Secondary | ICD-10-CM | POA: Diagnosis not present

## 2014-04-06 DIAGNOSIS — F331 Major depressive disorder, recurrent, moderate: Secondary | ICD-10-CM | POA: Diagnosis not present

## 2014-04-09 DIAGNOSIS — L570 Actinic keratosis: Secondary | ICD-10-CM | POA: Diagnosis not present

## 2014-04-09 DIAGNOSIS — Z85828 Personal history of other malignant neoplasm of skin: Secondary | ICD-10-CM | POA: Diagnosis not present

## 2014-04-09 DIAGNOSIS — L819 Disorder of pigmentation, unspecified: Secondary | ICD-10-CM | POA: Diagnosis not present

## 2014-04-09 DIAGNOSIS — L821 Other seborrheic keratosis: Secondary | ICD-10-CM | POA: Diagnosis not present

## 2014-04-09 DIAGNOSIS — D235 Other benign neoplasm of skin of trunk: Secondary | ICD-10-CM | POA: Diagnosis not present

## 2014-04-24 DIAGNOSIS — E782 Mixed hyperlipidemia: Secondary | ICD-10-CM | POA: Diagnosis not present

## 2014-04-24 DIAGNOSIS — G259 Extrapyramidal and movement disorder, unspecified: Secondary | ICD-10-CM | POA: Diagnosis not present

## 2014-04-24 DIAGNOSIS — I10 Essential (primary) hypertension: Secondary | ICD-10-CM | POA: Diagnosis not present

## 2014-04-24 DIAGNOSIS — Z0000 Encounter for general adult medical examination without abnormal findings: Secondary | ICD-10-CM | POA: Diagnosis not present

## 2014-04-24 DIAGNOSIS — Z1331 Encounter for screening for depression: Secondary | ICD-10-CM | POA: Diagnosis not present

## 2014-04-24 DIAGNOSIS — K219 Gastro-esophageal reflux disease without esophagitis: Secondary | ICD-10-CM | POA: Diagnosis not present

## 2014-04-24 DIAGNOSIS — F329 Major depressive disorder, single episode, unspecified: Secondary | ICD-10-CM | POA: Diagnosis not present

## 2014-05-02 DIAGNOSIS — N3941 Urge incontinence: Secondary | ICD-10-CM | POA: Diagnosis not present

## 2014-05-18 DIAGNOSIS — F331 Major depressive disorder, recurrent, moderate: Secondary | ICD-10-CM | POA: Diagnosis not present

## 2014-05-21 DIAGNOSIS — R209 Unspecified disturbances of skin sensation: Secondary | ICD-10-CM | POA: Diagnosis not present

## 2014-05-21 DIAGNOSIS — K1370 Unspecified lesions of oral mucosa: Secondary | ICD-10-CM | POA: Diagnosis not present

## 2014-06-06 DIAGNOSIS — N3941 Urge incontinence: Secondary | ICD-10-CM | POA: Diagnosis not present

## 2014-06-29 DIAGNOSIS — F331 Major depressive disorder, recurrent, moderate: Secondary | ICD-10-CM | POA: Diagnosis not present

## 2014-07-06 DIAGNOSIS — Z124 Encounter for screening for malignant neoplasm of cervix: Secondary | ICD-10-CM | POA: Diagnosis not present

## 2014-07-06 DIAGNOSIS — N952 Postmenopausal atrophic vaginitis: Secondary | ICD-10-CM | POA: Diagnosis not present

## 2014-07-06 DIAGNOSIS — Z780 Asymptomatic menopausal state: Secondary | ICD-10-CM | POA: Diagnosis not present

## 2014-07-06 DIAGNOSIS — Z01419 Encounter for gynecological examination (general) (routine) without abnormal findings: Secondary | ICD-10-CM | POA: Diagnosis not present

## 2014-07-18 DIAGNOSIS — N318 Other neuromuscular dysfunction of bladder: Secondary | ICD-10-CM | POA: Diagnosis not present

## 2014-07-19 DIAGNOSIS — N3941 Urge incontinence: Secondary | ICD-10-CM | POA: Diagnosis not present

## 2014-07-25 DIAGNOSIS — H2510 Age-related nuclear cataract, unspecified eye: Secondary | ICD-10-CM | POA: Diagnosis not present

## 2014-07-25 DIAGNOSIS — H269 Unspecified cataract: Secondary | ICD-10-CM | POA: Diagnosis not present

## 2014-07-25 DIAGNOSIS — H35419 Lattice degeneration of retina, unspecified eye: Secondary | ICD-10-CM | POA: Diagnosis not present

## 2014-08-10 DIAGNOSIS — F331 Major depressive disorder, recurrent, moderate: Secondary | ICD-10-CM | POA: Diagnosis not present

## 2014-08-15 DIAGNOSIS — H35419 Lattice degeneration of retina, unspecified eye: Secondary | ICD-10-CM | POA: Diagnosis not present

## 2014-08-15 DIAGNOSIS — H3554 Dystrophies primarily involving the retinal pigment epithelium: Secondary | ICD-10-CM | POA: Diagnosis not present

## 2014-08-15 DIAGNOSIS — H5000 Unspecified esotropia: Secondary | ICD-10-CM | POA: Diagnosis not present

## 2014-08-15 DIAGNOSIS — H2510 Age-related nuclear cataract, unspecified eye: Secondary | ICD-10-CM | POA: Diagnosis not present

## 2014-08-22 DIAGNOSIS — N3941 Urge incontinence: Secondary | ICD-10-CM | POA: Diagnosis not present

## 2014-09-20 DIAGNOSIS — F603 Borderline personality disorder: Secondary | ICD-10-CM | POA: Diagnosis not present

## 2014-09-20 DIAGNOSIS — R4181 Age-related cognitive decline: Secondary | ICD-10-CM | POA: Diagnosis not present

## 2014-09-20 DIAGNOSIS — R413 Other amnesia: Secondary | ICD-10-CM | POA: Diagnosis not present

## 2014-09-20 DIAGNOSIS — G255 Other chorea: Secondary | ICD-10-CM | POA: Diagnosis not present

## 2014-09-20 DIAGNOSIS — Z79899 Other long term (current) drug therapy: Secondary | ICD-10-CM | POA: Diagnosis not present

## 2014-09-21 DIAGNOSIS — F331 Major depressive disorder, recurrent, moderate: Secondary | ICD-10-CM | POA: Diagnosis not present

## 2014-09-26 DIAGNOSIS — N3941 Urge incontinence: Secondary | ICD-10-CM | POA: Diagnosis not present

## 2014-10-12 DIAGNOSIS — D2262 Melanocytic nevi of left upper limb, including shoulder: Secondary | ICD-10-CM | POA: Diagnosis not present

## 2014-10-12 DIAGNOSIS — L821 Other seborrheic keratosis: Secondary | ICD-10-CM | POA: Diagnosis not present

## 2014-10-12 DIAGNOSIS — Z85828 Personal history of other malignant neoplasm of skin: Secondary | ICD-10-CM | POA: Diagnosis not present

## 2014-10-12 DIAGNOSIS — L570 Actinic keratosis: Secondary | ICD-10-CM | POA: Diagnosis not present

## 2014-10-12 DIAGNOSIS — L812 Freckles: Secondary | ICD-10-CM | POA: Diagnosis not present

## 2014-10-12 DIAGNOSIS — D2261 Melanocytic nevi of right upper limb, including shoulder: Secondary | ICD-10-CM | POA: Diagnosis not present

## 2014-10-15 DIAGNOSIS — G255 Other chorea: Secondary | ICD-10-CM | POA: Diagnosis not present

## 2014-10-19 DIAGNOSIS — G255 Other chorea: Secondary | ICD-10-CM | POA: Diagnosis not present

## 2014-10-23 ENCOUNTER — Encounter: Payer: Self-pay | Admitting: Gastroenterology

## 2014-10-25 DIAGNOSIS — G43909 Migraine, unspecified, not intractable, without status migrainosus: Secondary | ICD-10-CM | POA: Diagnosis not present

## 2014-10-25 DIAGNOSIS — G2581 Restless legs syndrome: Secondary | ICD-10-CM | POA: Diagnosis not present

## 2014-10-25 DIAGNOSIS — I10 Essential (primary) hypertension: Secondary | ICD-10-CM | POA: Diagnosis not present

## 2014-10-25 DIAGNOSIS — Z23 Encounter for immunization: Secondary | ICD-10-CM | POA: Diagnosis not present

## 2014-10-25 DIAGNOSIS — E780 Pure hypercholesterolemia: Secondary | ICD-10-CM | POA: Diagnosis not present

## 2014-10-25 DIAGNOSIS — G259 Extrapyramidal and movement disorder, unspecified: Secondary | ICD-10-CM | POA: Diagnosis not present

## 2014-10-25 DIAGNOSIS — K219 Gastro-esophageal reflux disease without esophagitis: Secondary | ICD-10-CM | POA: Diagnosis not present

## 2014-10-25 DIAGNOSIS — F322 Major depressive disorder, single episode, severe without psychotic features: Secondary | ICD-10-CM | POA: Diagnosis not present

## 2014-10-26 DIAGNOSIS — F331 Major depressive disorder, recurrent, moderate: Secondary | ICD-10-CM | POA: Diagnosis not present

## 2014-10-31 DIAGNOSIS — N3941 Urge incontinence: Secondary | ICD-10-CM | POA: Diagnosis not present

## 2014-11-30 DIAGNOSIS — F331 Major depressive disorder, recurrent, moderate: Secondary | ICD-10-CM | POA: Diagnosis not present

## 2014-12-05 DIAGNOSIS — Z8709 Personal history of other diseases of the respiratory system: Secondary | ICD-10-CM | POA: Diagnosis not present

## 2014-12-05 DIAGNOSIS — Z8669 Personal history of other diseases of the nervous system and sense organs: Secondary | ICD-10-CM | POA: Diagnosis not present

## 2014-12-05 DIAGNOSIS — F09 Unspecified mental disorder due to known physiological condition: Secondary | ICD-10-CM | POA: Diagnosis not present

## 2014-12-05 DIAGNOSIS — F329 Major depressive disorder, single episode, unspecified: Secondary | ICD-10-CM | POA: Diagnosis not present

## 2014-12-05 DIAGNOSIS — N3941 Urge incontinence: Secondary | ICD-10-CM | POA: Diagnosis not present

## 2014-12-05 DIAGNOSIS — F603 Borderline personality disorder: Secondary | ICD-10-CM | POA: Diagnosis not present

## 2014-12-05 DIAGNOSIS — G2581 Restless legs syndrome: Secondary | ICD-10-CM | POA: Diagnosis not present

## 2015-01-09 DIAGNOSIS — N3941 Urge incontinence: Secondary | ICD-10-CM | POA: Diagnosis not present

## 2015-01-18 DIAGNOSIS — F331 Major depressive disorder, recurrent, moderate: Secondary | ICD-10-CM | POA: Diagnosis not present

## 2015-01-30 DIAGNOSIS — H2513 Age-related nuclear cataract, bilateral: Secondary | ICD-10-CM | POA: Diagnosis not present

## 2015-01-30 DIAGNOSIS — H35413 Lattice degeneration of retina, bilateral: Secondary | ICD-10-CM | POA: Diagnosis not present

## 2015-01-30 DIAGNOSIS — H269 Unspecified cataract: Secondary | ICD-10-CM | POA: Diagnosis not present

## 2015-01-30 DIAGNOSIS — H43813 Vitreous degeneration, bilateral: Secondary | ICD-10-CM | POA: Diagnosis not present

## 2015-02-15 ENCOUNTER — Other Ambulatory Visit: Payer: Self-pay | Admitting: Obstetrics and Gynecology

## 2015-02-15 DIAGNOSIS — Z1231 Encounter for screening mammogram for malignant neoplasm of breast: Secondary | ICD-10-CM

## 2015-02-18 DIAGNOSIS — G255 Other chorea: Secondary | ICD-10-CM | POA: Diagnosis not present

## 2015-02-18 DIAGNOSIS — G2581 Restless legs syndrome: Secondary | ICD-10-CM | POA: Diagnosis not present

## 2015-02-18 DIAGNOSIS — R413 Other amnesia: Secondary | ICD-10-CM | POA: Diagnosis not present

## 2015-03-01 DIAGNOSIS — H2513 Age-related nuclear cataract, bilateral: Secondary | ICD-10-CM | POA: Diagnosis not present

## 2015-03-01 DIAGNOSIS — H269 Unspecified cataract: Secondary | ICD-10-CM | POA: Diagnosis not present

## 2015-03-08 DIAGNOSIS — F331 Major depressive disorder, recurrent, moderate: Secondary | ICD-10-CM | POA: Diagnosis not present

## 2015-03-21 ENCOUNTER — Ambulatory Visit: Payer: Medicare Other

## 2015-03-25 DIAGNOSIS — E785 Hyperlipidemia, unspecified: Secondary | ICD-10-CM | POA: Diagnosis not present

## 2015-03-25 DIAGNOSIS — K219 Gastro-esophageal reflux disease without esophagitis: Secondary | ICD-10-CM | POA: Diagnosis not present

## 2015-03-25 DIAGNOSIS — Z882 Allergy status to sulfonamides status: Secondary | ICD-10-CM | POA: Diagnosis not present

## 2015-03-25 DIAGNOSIS — B009 Herpesviral infection, unspecified: Secondary | ICD-10-CM | POA: Diagnosis not present

## 2015-03-25 DIAGNOSIS — Z79899 Other long term (current) drug therapy: Secondary | ICD-10-CM | POA: Diagnosis not present

## 2015-03-25 DIAGNOSIS — H2511 Age-related nuclear cataract, right eye: Secondary | ICD-10-CM | POA: Diagnosis not present

## 2015-03-25 DIAGNOSIS — I10 Essential (primary) hypertension: Secondary | ICD-10-CM | POA: Diagnosis not present

## 2015-03-25 DIAGNOSIS — F0390 Unspecified dementia without behavioral disturbance: Secondary | ICD-10-CM | POA: Diagnosis not present

## 2015-03-28 ENCOUNTER — Ambulatory Visit: Payer: Medicare Other

## 2015-04-04 ENCOUNTER — Ambulatory Visit (INDEPENDENT_AMBULATORY_CARE_PROVIDER_SITE_OTHER): Payer: Medicare Other

## 2015-04-04 DIAGNOSIS — Z1231 Encounter for screening mammogram for malignant neoplasm of breast: Secondary | ICD-10-CM | POA: Diagnosis not present

## 2015-04-10 DIAGNOSIS — J329 Chronic sinusitis, unspecified: Secondary | ICD-10-CM | POA: Diagnosis not present

## 2015-04-24 DIAGNOSIS — H269 Unspecified cataract: Secondary | ICD-10-CM | POA: Diagnosis not present

## 2015-04-24 DIAGNOSIS — H2512 Age-related nuclear cataract, left eye: Secondary | ICD-10-CM | POA: Diagnosis not present

## 2015-04-24 DIAGNOSIS — G4730 Sleep apnea, unspecified: Secondary | ICD-10-CM | POA: Diagnosis not present

## 2015-04-24 DIAGNOSIS — Z961 Presence of intraocular lens: Secondary | ICD-10-CM | POA: Diagnosis not present

## 2015-04-24 DIAGNOSIS — Z9841 Cataract extraction status, right eye: Secondary | ICD-10-CM | POA: Diagnosis not present

## 2015-04-24 DIAGNOSIS — F0390 Unspecified dementia without behavioral disturbance: Secondary | ICD-10-CM | POA: Diagnosis not present

## 2015-04-24 DIAGNOSIS — Z79899 Other long term (current) drug therapy: Secondary | ICD-10-CM | POA: Diagnosis not present

## 2015-04-24 DIAGNOSIS — K219 Gastro-esophageal reflux disease without esophagitis: Secondary | ICD-10-CM | POA: Diagnosis not present

## 2015-04-24 DIAGNOSIS — E785 Hyperlipidemia, unspecified: Secondary | ICD-10-CM | POA: Diagnosis not present

## 2015-04-24 DIAGNOSIS — I10 Essential (primary) hypertension: Secondary | ICD-10-CM | POA: Diagnosis not present

## 2015-04-24 DIAGNOSIS — Z882 Allergy status to sulfonamides status: Secondary | ICD-10-CM | POA: Diagnosis not present

## 2015-05-03 DIAGNOSIS — D692 Other nonthrombocytopenic purpura: Secondary | ICD-10-CM | POA: Diagnosis not present

## 2015-05-03 DIAGNOSIS — L84 Corns and callosities: Secondary | ICD-10-CM | POA: Diagnosis not present

## 2015-05-03 DIAGNOSIS — Z85828 Personal history of other malignant neoplasm of skin: Secondary | ICD-10-CM | POA: Diagnosis not present

## 2015-05-03 DIAGNOSIS — L570 Actinic keratosis: Secondary | ICD-10-CM | POA: Diagnosis not present

## 2015-05-03 DIAGNOSIS — L821 Other seborrheic keratosis: Secondary | ICD-10-CM | POA: Diagnosis not present

## 2015-05-17 DIAGNOSIS — F331 Major depressive disorder, recurrent, moderate: Secondary | ICD-10-CM | POA: Diagnosis not present

## 2015-05-21 DIAGNOSIS — G43909 Migraine, unspecified, not intractable, without status migrainosus: Secondary | ICD-10-CM | POA: Diagnosis not present

## 2015-05-21 DIAGNOSIS — F322 Major depressive disorder, single episode, severe without psychotic features: Secondary | ICD-10-CM | POA: Diagnosis not present

## 2015-05-21 DIAGNOSIS — G259 Extrapyramidal and movement disorder, unspecified: Secondary | ICD-10-CM | POA: Diagnosis not present

## 2015-05-21 DIAGNOSIS — I10 Essential (primary) hypertension: Secondary | ICD-10-CM | POA: Diagnosis not present

## 2015-05-21 DIAGNOSIS — K219 Gastro-esophageal reflux disease without esophagitis: Secondary | ICD-10-CM | POA: Diagnosis not present

## 2015-05-21 DIAGNOSIS — Z1389 Encounter for screening for other disorder: Secondary | ICD-10-CM | POA: Diagnosis not present

## 2015-05-21 DIAGNOSIS — E780 Pure hypercholesterolemia: Secondary | ICD-10-CM | POA: Diagnosis not present

## 2015-05-21 DIAGNOSIS — G629 Polyneuropathy, unspecified: Secondary | ICD-10-CM | POA: Diagnosis not present

## 2015-05-21 DIAGNOSIS — M8588 Other specified disorders of bone density and structure, other site: Secondary | ICD-10-CM | POA: Diagnosis not present

## 2015-06-12 DIAGNOSIS — M810 Age-related osteoporosis without current pathological fracture: Secondary | ICD-10-CM | POA: Diagnosis not present

## 2015-06-17 DIAGNOSIS — R413 Other amnesia: Secondary | ICD-10-CM | POA: Diagnosis not present

## 2015-06-17 DIAGNOSIS — R569 Unspecified convulsions: Secondary | ICD-10-CM | POA: Diagnosis not present

## 2015-06-17 DIAGNOSIS — G255 Other chorea: Secondary | ICD-10-CM | POA: Diagnosis not present

## 2015-06-17 DIAGNOSIS — R404 Transient alteration of awareness: Secondary | ICD-10-CM | POA: Diagnosis not present

## 2015-06-17 DIAGNOSIS — G249 Dystonia, unspecified: Secondary | ICD-10-CM | POA: Diagnosis not present

## 2015-06-17 DIAGNOSIS — G2581 Restless legs syndrome: Secondary | ICD-10-CM | POA: Diagnosis not present

## 2015-06-25 DIAGNOSIS — L509 Urticaria, unspecified: Secondary | ICD-10-CM | POA: Diagnosis not present

## 2015-06-28 DIAGNOSIS — F331 Major depressive disorder, recurrent, moderate: Secondary | ICD-10-CM | POA: Diagnosis not present

## 2015-07-04 DIAGNOSIS — L509 Urticaria, unspecified: Secondary | ICD-10-CM | POA: Diagnosis not present

## 2015-07-05 DIAGNOSIS — L239 Allergic contact dermatitis, unspecified cause: Secondary | ICD-10-CM | POA: Diagnosis not present

## 2015-07-05 DIAGNOSIS — Z85828 Personal history of other malignant neoplasm of skin: Secondary | ICD-10-CM | POA: Diagnosis not present

## 2015-07-10 DIAGNOSIS — R2990 Unspecified symptoms and signs involving the nervous system: Secondary | ICD-10-CM | POA: Diagnosis not present

## 2015-07-10 DIAGNOSIS — R569 Unspecified convulsions: Secondary | ICD-10-CM | POA: Diagnosis not present

## 2015-07-10 DIAGNOSIS — R404 Transient alteration of awareness: Secondary | ICD-10-CM | POA: Diagnosis not present

## 2015-07-10 DIAGNOSIS — R413 Other amnesia: Secondary | ICD-10-CM | POA: Diagnosis not present

## 2015-07-15 DIAGNOSIS — H50311 Intermittent monocular esotropia, right eye: Secondary | ICD-10-CM | POA: Diagnosis not present

## 2015-07-15 DIAGNOSIS — H532 Diplopia: Secondary | ICD-10-CM | POA: Diagnosis not present

## 2015-07-24 DIAGNOSIS — R87610 Atypical squamous cells of undetermined significance on cytologic smear of cervix (ASC-US): Secondary | ICD-10-CM | POA: Diagnosis not present

## 2015-07-24 DIAGNOSIS — N952 Postmenopausal atrophic vaginitis: Secondary | ICD-10-CM | POA: Diagnosis not present

## 2015-07-24 DIAGNOSIS — Z124 Encounter for screening for malignant neoplasm of cervix: Secondary | ICD-10-CM | POA: Diagnosis not present

## 2015-08-09 DIAGNOSIS — F331 Major depressive disorder, recurrent, moderate: Secondary | ICD-10-CM | POA: Diagnosis not present

## 2015-09-18 DIAGNOSIS — H50311 Intermittent monocular esotropia, right eye: Secondary | ICD-10-CM | POA: Diagnosis not present

## 2015-09-26 DIAGNOSIS — I739 Peripheral vascular disease, unspecified: Secondary | ICD-10-CM | POA: Diagnosis not present

## 2015-09-26 DIAGNOSIS — R413 Other amnesia: Secondary | ICD-10-CM | POA: Diagnosis not present

## 2015-09-26 DIAGNOSIS — K219 Gastro-esophageal reflux disease without esophagitis: Secondary | ICD-10-CM | POA: Diagnosis not present

## 2015-09-26 DIAGNOSIS — E785 Hyperlipidemia, unspecified: Secondary | ICD-10-CM | POA: Diagnosis not present

## 2015-09-26 DIAGNOSIS — G249 Dystonia, unspecified: Secondary | ICD-10-CM | POA: Diagnosis not present

## 2015-09-26 DIAGNOSIS — G255 Other chorea: Secondary | ICD-10-CM | POA: Diagnosis not present

## 2015-09-26 DIAGNOSIS — Z9889 Other specified postprocedural states: Secondary | ICD-10-CM | POA: Diagnosis not present

## 2015-09-26 DIAGNOSIS — Z882 Allergy status to sulfonamides status: Secondary | ICD-10-CM | POA: Diagnosis not present

## 2015-09-26 DIAGNOSIS — Z79899 Other long term (current) drug therapy: Secondary | ICD-10-CM | POA: Diagnosis not present

## 2015-09-26 DIAGNOSIS — H532 Diplopia: Secondary | ICD-10-CM | POA: Diagnosis not present

## 2015-09-26 DIAGNOSIS — I10 Essential (primary) hypertension: Secondary | ICD-10-CM | POA: Diagnosis not present

## 2015-09-26 DIAGNOSIS — H50311 Intermittent monocular esotropia, right eye: Secondary | ICD-10-CM | POA: Diagnosis not present

## 2015-10-22 DIAGNOSIS — Z85828 Personal history of other malignant neoplasm of skin: Secondary | ICD-10-CM | POA: Diagnosis not present

## 2015-10-22 DIAGNOSIS — L720 Epidermal cyst: Secondary | ICD-10-CM | POA: Diagnosis not present

## 2015-10-22 DIAGNOSIS — L812 Freckles: Secondary | ICD-10-CM | POA: Diagnosis not present

## 2015-10-22 DIAGNOSIS — L821 Other seborrheic keratosis: Secondary | ICD-10-CM | POA: Diagnosis not present

## 2015-10-22 DIAGNOSIS — D225 Melanocytic nevi of trunk: Secondary | ICD-10-CM | POA: Diagnosis not present

## 2015-10-22 DIAGNOSIS — L738 Other specified follicular disorders: Secondary | ICD-10-CM | POA: Diagnosis not present

## 2015-10-28 ENCOUNTER — Ambulatory Visit (INDEPENDENT_AMBULATORY_CARE_PROVIDER_SITE_OTHER): Payer: Medicare Other | Admitting: Sports Medicine

## 2015-10-28 ENCOUNTER — Encounter: Payer: Self-pay | Admitting: Sports Medicine

## 2015-10-28 VITALS — BP 152/81 | Ht 62.0 in | Wt 124.4 lb

## 2015-10-28 DIAGNOSIS — M216X9 Other acquired deformities of unspecified foot: Secondary | ICD-10-CM

## 2015-10-28 NOTE — Progress Notes (Signed)
   Subjective:    Patient ID: Lisa Jackson, female    DOB: [birth year], 59 y.o.   MRN: [id number]  HPI chief complaint: Bilateral knee and bilateral foot pain  Very pleasant 59 year old female comes in today requesting new orthotics. Custom orthotics were constructed for her in January 2015. She has done very well with them. She remains active and enjoys running. She has decided to try to run a half marathon. Her knee pain and foot pain improved greatly with orthotics. No recent trauma.  Interim medical history reviewed Medications reviewed Allergies reviewed    Review of Systems    as above Objective:   Physical Exam  Well-developed, well-nourished. No acute distress  Examination of both feet show a cavus foot with forefoot varus, hindfoot varus, and splaying of the first, second, and third toes. No tenderness to palpation. No swelling. Neurovascularly intact distally.      Assessment & Plan:  Cavus foot  New orthotics were constructed today. Patient found them to be comfortable prior to leaving the office. A total of 30 minutes was spent with the patient with greater than 50% of the time spent in face-to-face consultation discussing orthotic construction, instruction, and fitting. Follow-up as needed.  Patient was fitted for a : standard, cushioned, semi-rigid orthotic. The orthotic was heated and afterward the patient stood on the orthotic blank positioned on the orthotic stand. The patient was positioned in subtalar neutral position and 10 degrees of ankle dorsiflexion in a weight bearing stance. After completion of molding, a stable base was applied to the orthotic blank. The blank was ground to a stable position for weight bearing. Size: 8 Base: blue EVA Posting: none Additional orthotic padding: none

## 2015-11-01 DIAGNOSIS — F331 Major depressive disorder, recurrent, moderate: Secondary | ICD-10-CM | POA: Diagnosis not present

## 2015-11-21 DIAGNOSIS — G2581 Restless legs syndrome: Secondary | ICD-10-CM | POA: Diagnosis not present

## 2015-11-21 DIAGNOSIS — G259 Extrapyramidal and movement disorder, unspecified: Secondary | ICD-10-CM | POA: Diagnosis not present

## 2015-11-21 DIAGNOSIS — F0390 Unspecified dementia without behavioral disturbance: Secondary | ICD-10-CM | POA: Diagnosis not present

## 2015-11-21 DIAGNOSIS — E7800 Pure hypercholesterolemia, unspecified: Secondary | ICD-10-CM | POA: Diagnosis not present

## 2015-11-21 DIAGNOSIS — M810 Age-related osteoporosis without current pathological fracture: Secondary | ICD-10-CM | POA: Diagnosis not present

## 2015-11-21 DIAGNOSIS — K219 Gastro-esophageal reflux disease without esophagitis: Secondary | ICD-10-CM | POA: Diagnosis not present

## 2015-11-21 DIAGNOSIS — F322 Major depressive disorder, single episode, severe without psychotic features: Secondary | ICD-10-CM | POA: Diagnosis not present

## 2015-11-21 DIAGNOSIS — I10 Essential (primary) hypertension: Secondary | ICD-10-CM | POA: Diagnosis not present

## 2015-11-21 DIAGNOSIS — M797 Fibromyalgia: Secondary | ICD-10-CM | POA: Diagnosis not present

## 2015-12-04 DIAGNOSIS — G255 Other chorea: Secondary | ICD-10-CM | POA: Diagnosis not present

## 2015-12-04 DIAGNOSIS — R413 Other amnesia: Secondary | ICD-10-CM | POA: Diagnosis not present

## 2015-12-04 DIAGNOSIS — R252 Cramp and spasm: Secondary | ICD-10-CM | POA: Diagnosis not present

## 2015-12-04 DIAGNOSIS — R4189 Other symptoms and signs involving cognitive functions and awareness: Secondary | ICD-10-CM | POA: Diagnosis not present

## 2015-12-05 DIAGNOSIS — E785 Hyperlipidemia, unspecified: Secondary | ICD-10-CM | POA: Diagnosis not present

## 2015-12-05 DIAGNOSIS — K219 Gastro-esophageal reflux disease without esophagitis: Secondary | ICD-10-CM | POA: Diagnosis not present

## 2015-12-05 DIAGNOSIS — Y936A Activity, physical games generally associated with school recess, summer camp and children: Secondary | ICD-10-CM | POA: Diagnosis not present

## 2015-12-05 DIAGNOSIS — W2105XA Struck by basketball, initial encounter: Secondary | ICD-10-CM | POA: Diagnosis not present

## 2015-12-05 DIAGNOSIS — S42101A Fracture of unspecified part of scapula, right shoulder, initial encounter for closed fracture: Secondary | ICD-10-CM | POA: Diagnosis not present

## 2015-12-05 DIAGNOSIS — Z79899 Other long term (current) drug therapy: Secondary | ICD-10-CM | POA: Diagnosis not present

## 2015-12-05 DIAGNOSIS — I10 Essential (primary) hypertension: Secondary | ICD-10-CM | POA: Diagnosis not present

## 2015-12-05 DIAGNOSIS — Z882 Allergy status to sulfonamides status: Secondary | ICD-10-CM | POA: Diagnosis not present

## 2015-12-05 DIAGNOSIS — R52 Pain, unspecified: Secondary | ICD-10-CM | POA: Diagnosis not present

## 2015-12-05 DIAGNOSIS — S42141A Displaced fracture of glenoid cavity of scapula, right shoulder, initial encounter for closed fracture: Secondary | ICD-10-CM | POA: Diagnosis not present

## 2015-12-05 DIAGNOSIS — M25511 Pain in right shoulder: Secondary | ICD-10-CM | POA: Diagnosis not present

## 2015-12-05 DIAGNOSIS — M1990 Unspecified osteoarthritis, unspecified site: Secondary | ICD-10-CM | POA: Diagnosis not present

## 2015-12-05 DIAGNOSIS — S42151A Displaced fracture of neck of scapula, right shoulder, initial encounter for closed fracture: Secondary | ICD-10-CM | POA: Diagnosis not present

## 2015-12-05 DIAGNOSIS — Z888 Allergy status to other drugs, medicaments and biological substances status: Secondary | ICD-10-CM | POA: Diagnosis not present

## 2015-12-09 DIAGNOSIS — S42114A Nondisplaced fracture of body of scapula, right shoulder, initial encounter for closed fracture: Secondary | ICD-10-CM | POA: Diagnosis not present

## 2015-12-23 DIAGNOSIS — S42114D Nondisplaced fracture of body of scapula, right shoulder, subsequent encounter for fracture with routine healing: Secondary | ICD-10-CM | POA: Diagnosis not present

## 2015-12-27 DIAGNOSIS — F331 Major depressive disorder, recurrent, moderate: Secondary | ICD-10-CM | POA: Diagnosis not present

## 2016-01-06 DIAGNOSIS — S42114D Nondisplaced fracture of body of scapula, right shoulder, subsequent encounter for fracture with routine healing: Secondary | ICD-10-CM | POA: Diagnosis not present

## 2016-01-27 DIAGNOSIS — F331 Major depressive disorder, recurrent, moderate: Secondary | ICD-10-CM | POA: Diagnosis not present

## 2016-01-27 DIAGNOSIS — S42114D Nondisplaced fracture of body of scapula, right shoulder, subsequent encounter for fracture with routine healing: Secondary | ICD-10-CM | POA: Diagnosis not present

## 2016-02-04 DIAGNOSIS — M25611 Stiffness of right shoulder, not elsewhere classified: Secondary | ICD-10-CM | POA: Diagnosis not present

## 2016-02-04 DIAGNOSIS — M6281 Muscle weakness (generalized): Secondary | ICD-10-CM | POA: Diagnosis not present

## 2016-02-04 DIAGNOSIS — M25511 Pain in right shoulder: Secondary | ICD-10-CM | POA: Diagnosis not present

## 2016-02-06 DIAGNOSIS — M6281 Muscle weakness (generalized): Secondary | ICD-10-CM | POA: Diagnosis not present

## 2016-02-06 DIAGNOSIS — M25611 Stiffness of right shoulder, not elsewhere classified: Secondary | ICD-10-CM | POA: Diagnosis not present

## 2016-02-06 DIAGNOSIS — M25511 Pain in right shoulder: Secondary | ICD-10-CM | POA: Diagnosis not present

## 2016-02-10 DIAGNOSIS — M25511 Pain in right shoulder: Secondary | ICD-10-CM | POA: Diagnosis not present

## 2016-02-10 DIAGNOSIS — M25611 Stiffness of right shoulder, not elsewhere classified: Secondary | ICD-10-CM | POA: Diagnosis not present

## 2016-02-10 DIAGNOSIS — M6281 Muscle weakness (generalized): Secondary | ICD-10-CM | POA: Diagnosis not present

## 2016-02-12 DIAGNOSIS — M6281 Muscle weakness (generalized): Secondary | ICD-10-CM | POA: Diagnosis not present

## 2016-02-12 DIAGNOSIS — M25611 Stiffness of right shoulder, not elsewhere classified: Secondary | ICD-10-CM | POA: Diagnosis not present

## 2016-02-12 DIAGNOSIS — M25511 Pain in right shoulder: Secondary | ICD-10-CM | POA: Diagnosis not present

## 2016-02-19 DIAGNOSIS — K449 Diaphragmatic hernia without obstruction or gangrene: Secondary | ICD-10-CM | POA: Diagnosis not present

## 2016-02-19 DIAGNOSIS — R0602 Shortness of breath: Secondary | ICD-10-CM | POA: Diagnosis not present

## 2016-02-19 DIAGNOSIS — J329 Chronic sinusitis, unspecified: Secondary | ICD-10-CM | POA: Diagnosis not present

## 2016-02-19 DIAGNOSIS — J40 Bronchitis, not specified as acute or chronic: Secondary | ICD-10-CM | POA: Diagnosis not present

## 2016-02-25 DIAGNOSIS — M25611 Stiffness of right shoulder, not elsewhere classified: Secondary | ICD-10-CM | POA: Diagnosis not present

## 2016-02-25 DIAGNOSIS — M6281 Muscle weakness (generalized): Secondary | ICD-10-CM | POA: Diagnosis not present

## 2016-02-25 DIAGNOSIS — M25511 Pain in right shoulder: Secondary | ICD-10-CM | POA: Diagnosis not present

## 2016-02-27 DIAGNOSIS — S42114D Nondisplaced fracture of body of scapula, right shoulder, subsequent encounter for fracture with routine healing: Secondary | ICD-10-CM | POA: Diagnosis not present

## 2016-02-27 DIAGNOSIS — M25511 Pain in right shoulder: Secondary | ICD-10-CM | POA: Diagnosis not present

## 2016-02-27 DIAGNOSIS — M6281 Muscle weakness (generalized): Secondary | ICD-10-CM | POA: Diagnosis not present

## 2016-02-27 DIAGNOSIS — M25611 Stiffness of right shoulder, not elsewhere classified: Secondary | ICD-10-CM | POA: Diagnosis not present

## 2016-03-03 DIAGNOSIS — M25511 Pain in right shoulder: Secondary | ICD-10-CM | POA: Diagnosis not present

## 2016-03-03 DIAGNOSIS — M6281 Muscle weakness (generalized): Secondary | ICD-10-CM | POA: Diagnosis not present

## 2016-03-03 DIAGNOSIS — M25611 Stiffness of right shoulder, not elsewhere classified: Secondary | ICD-10-CM | POA: Diagnosis not present

## 2016-03-04 DIAGNOSIS — H532 Diplopia: Secondary | ICD-10-CM | POA: Diagnosis not present

## 2016-03-04 DIAGNOSIS — H50011 Monocular esotropia, right eye: Secondary | ICD-10-CM | POA: Diagnosis not present

## 2016-03-05 DIAGNOSIS — M25511 Pain in right shoulder: Secondary | ICD-10-CM | POA: Diagnosis not present

## 2016-03-05 DIAGNOSIS — M6281 Muscle weakness (generalized): Secondary | ICD-10-CM | POA: Diagnosis not present

## 2016-03-05 DIAGNOSIS — M25611 Stiffness of right shoulder, not elsewhere classified: Secondary | ICD-10-CM | POA: Diagnosis not present

## 2016-03-09 DIAGNOSIS — M25611 Stiffness of right shoulder, not elsewhere classified: Secondary | ICD-10-CM | POA: Diagnosis not present

## 2016-03-09 DIAGNOSIS — M6281 Muscle weakness (generalized): Secondary | ICD-10-CM | POA: Diagnosis not present

## 2016-03-09 DIAGNOSIS — M25511 Pain in right shoulder: Secondary | ICD-10-CM | POA: Diagnosis not present

## 2016-03-11 DIAGNOSIS — M25511 Pain in right shoulder: Secondary | ICD-10-CM | POA: Diagnosis not present

## 2016-03-11 DIAGNOSIS — M25611 Stiffness of right shoulder, not elsewhere classified: Secondary | ICD-10-CM | POA: Diagnosis not present

## 2016-03-11 DIAGNOSIS — M6281 Muscle weakness (generalized): Secondary | ICD-10-CM | POA: Diagnosis not present

## 2016-03-12 DIAGNOSIS — H50111 Monocular exotropia, right eye: Secondary | ICD-10-CM | POA: Diagnosis not present

## 2016-03-12 DIAGNOSIS — E785 Hyperlipidemia, unspecified: Secondary | ICD-10-CM | POA: Diagnosis not present

## 2016-03-12 DIAGNOSIS — H532 Diplopia: Secondary | ICD-10-CM | POA: Diagnosis not present

## 2016-03-12 DIAGNOSIS — H50011 Monocular esotropia, right eye: Secondary | ICD-10-CM | POA: Diagnosis not present

## 2016-03-12 DIAGNOSIS — K219 Gastro-esophageal reflux disease without esophagitis: Secondary | ICD-10-CM | POA: Diagnosis not present

## 2016-03-12 DIAGNOSIS — Z780 Asymptomatic menopausal state: Secondary | ICD-10-CM | POA: Diagnosis not present

## 2016-03-12 DIAGNOSIS — I10 Essential (primary) hypertension: Secondary | ICD-10-CM | POA: Diagnosis not present

## 2016-03-12 DIAGNOSIS — Z9889 Other specified postprocedural states: Secondary | ICD-10-CM | POA: Diagnosis not present

## 2016-03-12 DIAGNOSIS — Z961 Presence of intraocular lens: Secondary | ICD-10-CM | POA: Diagnosis not present

## 2016-03-24 DIAGNOSIS — M25611 Stiffness of right shoulder, not elsewhere classified: Secondary | ICD-10-CM | POA: Diagnosis not present

## 2016-03-24 DIAGNOSIS — M25511 Pain in right shoulder: Secondary | ICD-10-CM | POA: Diagnosis not present

## 2016-03-24 DIAGNOSIS — M6281 Muscle weakness (generalized): Secondary | ICD-10-CM | POA: Diagnosis not present

## 2016-03-26 DIAGNOSIS — M6281 Muscle weakness (generalized): Secondary | ICD-10-CM | POA: Diagnosis not present

## 2016-03-26 DIAGNOSIS — M25511 Pain in right shoulder: Secondary | ICD-10-CM | POA: Diagnosis not present

## 2016-03-26 DIAGNOSIS — M25611 Stiffness of right shoulder, not elsewhere classified: Secondary | ICD-10-CM | POA: Diagnosis not present

## 2016-03-30 DIAGNOSIS — S42114D Nondisplaced fracture of body of scapula, right shoulder, subsequent encounter for fracture with routine healing: Secondary | ICD-10-CM | POA: Diagnosis not present

## 2016-03-31 DIAGNOSIS — M25611 Stiffness of right shoulder, not elsewhere classified: Secondary | ICD-10-CM | POA: Diagnosis not present

## 2016-03-31 DIAGNOSIS — M6281 Muscle weakness (generalized): Secondary | ICD-10-CM | POA: Diagnosis not present

## 2016-03-31 DIAGNOSIS — M25511 Pain in right shoulder: Secondary | ICD-10-CM | POA: Diagnosis not present

## 2016-04-02 DIAGNOSIS — M25611 Stiffness of right shoulder, not elsewhere classified: Secondary | ICD-10-CM | POA: Diagnosis not present

## 2016-04-02 DIAGNOSIS — M6281 Muscle weakness (generalized): Secondary | ICD-10-CM | POA: Diagnosis not present

## 2016-04-02 DIAGNOSIS — M25511 Pain in right shoulder: Secondary | ICD-10-CM | POA: Diagnosis not present

## 2016-04-07 DIAGNOSIS — M25511 Pain in right shoulder: Secondary | ICD-10-CM | POA: Diagnosis not present

## 2016-04-07 DIAGNOSIS — M6281 Muscle weakness (generalized): Secondary | ICD-10-CM | POA: Diagnosis not present

## 2016-04-07 DIAGNOSIS — M25611 Stiffness of right shoulder, not elsewhere classified: Secondary | ICD-10-CM | POA: Diagnosis not present

## 2016-04-09 DIAGNOSIS — M25511 Pain in right shoulder: Secondary | ICD-10-CM | POA: Diagnosis not present

## 2016-04-09 DIAGNOSIS — M6281 Muscle weakness (generalized): Secondary | ICD-10-CM | POA: Diagnosis not present

## 2016-04-09 DIAGNOSIS — M25611 Stiffness of right shoulder, not elsewhere classified: Secondary | ICD-10-CM | POA: Diagnosis not present

## 2016-04-13 DIAGNOSIS — M25611 Stiffness of right shoulder, not elsewhere classified: Secondary | ICD-10-CM | POA: Diagnosis not present

## 2016-04-13 DIAGNOSIS — M25511 Pain in right shoulder: Secondary | ICD-10-CM | POA: Diagnosis not present

## 2016-04-13 DIAGNOSIS — M6281 Muscle weakness (generalized): Secondary | ICD-10-CM | POA: Diagnosis not present

## 2016-04-16 DIAGNOSIS — M25611 Stiffness of right shoulder, not elsewhere classified: Secondary | ICD-10-CM | POA: Diagnosis not present

## 2016-04-16 DIAGNOSIS — M6281 Muscle weakness (generalized): Secondary | ICD-10-CM | POA: Diagnosis not present

## 2016-04-16 DIAGNOSIS — M25511 Pain in right shoulder: Secondary | ICD-10-CM | POA: Diagnosis not present

## 2016-04-17 DIAGNOSIS — F331 Major depressive disorder, recurrent, moderate: Secondary | ICD-10-CM | POA: Diagnosis not present

## 2016-04-20 DIAGNOSIS — M25611 Stiffness of right shoulder, not elsewhere classified: Secondary | ICD-10-CM | POA: Diagnosis not present

## 2016-04-20 DIAGNOSIS — M6281 Muscle weakness (generalized): Secondary | ICD-10-CM | POA: Diagnosis not present

## 2016-04-20 DIAGNOSIS — M25511 Pain in right shoulder: Secondary | ICD-10-CM | POA: Diagnosis not present

## 2016-04-23 DIAGNOSIS — L738 Other specified follicular disorders: Secondary | ICD-10-CM | POA: Diagnosis not present

## 2016-04-23 DIAGNOSIS — D2271 Melanocytic nevi of right lower limb, including hip: Secondary | ICD-10-CM | POA: Diagnosis not present

## 2016-04-23 DIAGNOSIS — Z85828 Personal history of other malignant neoplasm of skin: Secondary | ICD-10-CM | POA: Diagnosis not present

## 2016-04-23 DIAGNOSIS — L812 Freckles: Secondary | ICD-10-CM | POA: Diagnosis not present

## 2016-04-23 DIAGNOSIS — L821 Other seborrheic keratosis: Secondary | ICD-10-CM | POA: Diagnosis not present

## 2016-04-27 DIAGNOSIS — S42114D Nondisplaced fracture of body of scapula, right shoulder, subsequent encounter for fracture with routine healing: Secondary | ICD-10-CM | POA: Diagnosis not present

## 2016-04-28 DIAGNOSIS — M25611 Stiffness of right shoulder, not elsewhere classified: Secondary | ICD-10-CM | POA: Diagnosis not present

## 2016-04-28 DIAGNOSIS — M25511 Pain in right shoulder: Secondary | ICD-10-CM | POA: Diagnosis not present

## 2016-04-28 DIAGNOSIS — M6281 Muscle weakness (generalized): Secondary | ICD-10-CM | POA: Diagnosis not present

## 2016-04-30 DIAGNOSIS — M6281 Muscle weakness (generalized): Secondary | ICD-10-CM | POA: Diagnosis not present

## 2016-04-30 DIAGNOSIS — M25511 Pain in right shoulder: Secondary | ICD-10-CM | POA: Diagnosis not present

## 2016-04-30 DIAGNOSIS — M25611 Stiffness of right shoulder, not elsewhere classified: Secondary | ICD-10-CM | POA: Diagnosis not present

## 2016-05-04 DIAGNOSIS — M25511 Pain in right shoulder: Secondary | ICD-10-CM | POA: Diagnosis not present

## 2016-05-04 DIAGNOSIS — M6281 Muscle weakness (generalized): Secondary | ICD-10-CM | POA: Diagnosis not present

## 2016-05-04 DIAGNOSIS — M25611 Stiffness of right shoulder, not elsewhere classified: Secondary | ICD-10-CM | POA: Diagnosis not present

## 2016-05-06 DIAGNOSIS — M25511 Pain in right shoulder: Secondary | ICD-10-CM | POA: Diagnosis not present

## 2016-05-06 DIAGNOSIS — M6281 Muscle weakness (generalized): Secondary | ICD-10-CM | POA: Diagnosis not present

## 2016-05-06 DIAGNOSIS — M25611 Stiffness of right shoulder, not elsewhere classified: Secondary | ICD-10-CM | POA: Diagnosis not present

## 2016-05-12 DIAGNOSIS — M6281 Muscle weakness (generalized): Secondary | ICD-10-CM | POA: Diagnosis not present

## 2016-05-12 DIAGNOSIS — M25511 Pain in right shoulder: Secondary | ICD-10-CM | POA: Diagnosis not present

## 2016-05-12 DIAGNOSIS — M25611 Stiffness of right shoulder, not elsewhere classified: Secondary | ICD-10-CM | POA: Diagnosis not present

## 2016-05-14 DIAGNOSIS — M25511 Pain in right shoulder: Secondary | ICD-10-CM | POA: Diagnosis not present

## 2016-05-14 DIAGNOSIS — M25611 Stiffness of right shoulder, not elsewhere classified: Secondary | ICD-10-CM | POA: Diagnosis not present

## 2016-05-14 DIAGNOSIS — M6281 Muscle weakness (generalized): Secondary | ICD-10-CM | POA: Diagnosis not present

## 2016-05-19 DIAGNOSIS — M25611 Stiffness of right shoulder, not elsewhere classified: Secondary | ICD-10-CM | POA: Diagnosis not present

## 2016-05-19 DIAGNOSIS — M6281 Muscle weakness (generalized): Secondary | ICD-10-CM | POA: Diagnosis not present

## 2016-05-19 DIAGNOSIS — M25511 Pain in right shoulder: Secondary | ICD-10-CM | POA: Diagnosis not present

## 2016-05-21 DIAGNOSIS — M25511 Pain in right shoulder: Secondary | ICD-10-CM | POA: Diagnosis not present

## 2016-05-21 DIAGNOSIS — M6281 Muscle weakness (generalized): Secondary | ICD-10-CM | POA: Diagnosis not present

## 2016-05-21 DIAGNOSIS — M25611 Stiffness of right shoulder, not elsewhere classified: Secondary | ICD-10-CM | POA: Diagnosis not present

## 2016-05-25 DIAGNOSIS — G259 Extrapyramidal and movement disorder, unspecified: Secondary | ICD-10-CM | POA: Diagnosis not present

## 2016-05-25 DIAGNOSIS — G2581 Restless legs syndrome: Secondary | ICD-10-CM | POA: Diagnosis not present

## 2016-05-25 DIAGNOSIS — E7800 Pure hypercholesterolemia, unspecified: Secondary | ICD-10-CM | POA: Diagnosis not present

## 2016-05-25 DIAGNOSIS — Z0000 Encounter for general adult medical examination without abnormal findings: Secondary | ICD-10-CM | POA: Diagnosis not present

## 2016-05-25 DIAGNOSIS — Z1389 Encounter for screening for other disorder: Secondary | ICD-10-CM | POA: Diagnosis not present

## 2016-05-25 DIAGNOSIS — M810 Age-related osteoporosis without current pathological fracture: Secondary | ICD-10-CM | POA: Diagnosis not present

## 2016-05-25 DIAGNOSIS — F0390 Unspecified dementia without behavioral disturbance: Secondary | ICD-10-CM | POA: Diagnosis not present

## 2016-05-25 DIAGNOSIS — G629 Polyneuropathy, unspecified: Secondary | ICD-10-CM | POA: Diagnosis not present

## 2016-05-25 DIAGNOSIS — R269 Unspecified abnormalities of gait and mobility: Secondary | ICD-10-CM | POA: Diagnosis not present

## 2016-05-25 DIAGNOSIS — S42114D Nondisplaced fracture of body of scapula, right shoulder, subsequent encounter for fracture with routine healing: Secondary | ICD-10-CM | POA: Diagnosis not present

## 2016-05-25 DIAGNOSIS — F322 Major depressive disorder, single episode, severe without psychotic features: Secondary | ICD-10-CM | POA: Diagnosis not present

## 2016-05-25 DIAGNOSIS — I10 Essential (primary) hypertension: Secondary | ICD-10-CM | POA: Diagnosis not present

## 2016-05-26 DIAGNOSIS — M25611 Stiffness of right shoulder, not elsewhere classified: Secondary | ICD-10-CM | POA: Diagnosis not present

## 2016-05-26 DIAGNOSIS — M6281 Muscle weakness (generalized): Secondary | ICD-10-CM | POA: Diagnosis not present

## 2016-05-26 DIAGNOSIS — M25511 Pain in right shoulder: Secondary | ICD-10-CM | POA: Diagnosis not present

## 2016-05-29 DIAGNOSIS — F331 Major depressive disorder, recurrent, moderate: Secondary | ICD-10-CM | POA: Diagnosis not present

## 2016-06-01 DIAGNOSIS — M25611 Stiffness of right shoulder, not elsewhere classified: Secondary | ICD-10-CM | POA: Diagnosis not present

## 2016-06-01 DIAGNOSIS — M6281 Muscle weakness (generalized): Secondary | ICD-10-CM | POA: Diagnosis not present

## 2016-06-01 DIAGNOSIS — M25511 Pain in right shoulder: Secondary | ICD-10-CM | POA: Diagnosis not present

## 2016-06-03 DIAGNOSIS — M25511 Pain in right shoulder: Secondary | ICD-10-CM | POA: Diagnosis not present

## 2016-06-03 DIAGNOSIS — M25611 Stiffness of right shoulder, not elsewhere classified: Secondary | ICD-10-CM | POA: Diagnosis not present

## 2016-06-03 DIAGNOSIS — M6281 Muscle weakness (generalized): Secondary | ICD-10-CM | POA: Diagnosis not present

## 2016-06-09 DIAGNOSIS — M25511 Pain in right shoulder: Secondary | ICD-10-CM | POA: Diagnosis not present

## 2016-06-09 DIAGNOSIS — M6281 Muscle weakness (generalized): Secondary | ICD-10-CM | POA: Diagnosis not present

## 2016-06-09 DIAGNOSIS — M25611 Stiffness of right shoulder, not elsewhere classified: Secondary | ICD-10-CM | POA: Diagnosis not present

## 2016-06-11 DIAGNOSIS — M6281 Muscle weakness (generalized): Secondary | ICD-10-CM | POA: Diagnosis not present

## 2016-06-11 DIAGNOSIS — M25611 Stiffness of right shoulder, not elsewhere classified: Secondary | ICD-10-CM | POA: Diagnosis not present

## 2016-06-11 DIAGNOSIS — M25511 Pain in right shoulder: Secondary | ICD-10-CM | POA: Diagnosis not present

## 2016-06-16 DIAGNOSIS — M25511 Pain in right shoulder: Secondary | ICD-10-CM | POA: Diagnosis not present

## 2016-06-16 DIAGNOSIS — M25611 Stiffness of right shoulder, not elsewhere classified: Secondary | ICD-10-CM | POA: Diagnosis not present

## 2016-06-16 DIAGNOSIS — M6281 Muscle weakness (generalized): Secondary | ICD-10-CM | POA: Diagnosis not present

## 2016-06-18 DIAGNOSIS — M25611 Stiffness of right shoulder, not elsewhere classified: Secondary | ICD-10-CM | POA: Diagnosis not present

## 2016-06-18 DIAGNOSIS — M25511 Pain in right shoulder: Secondary | ICD-10-CM | POA: Diagnosis not present

## 2016-06-18 DIAGNOSIS — M6281 Muscle weakness (generalized): Secondary | ICD-10-CM | POA: Diagnosis not present

## 2016-06-26 DIAGNOSIS — M25611 Stiffness of right shoulder, not elsewhere classified: Secondary | ICD-10-CM | POA: Diagnosis not present

## 2016-06-26 DIAGNOSIS — M6281 Muscle weakness (generalized): Secondary | ICD-10-CM | POA: Diagnosis not present

## 2016-06-26 DIAGNOSIS — M25511 Pain in right shoulder: Secondary | ICD-10-CM | POA: Diagnosis not present

## 2016-06-29 DIAGNOSIS — S42114D Nondisplaced fracture of body of scapula, right shoulder, subsequent encounter for fracture with routine healing: Secondary | ICD-10-CM | POA: Diagnosis not present

## 2016-06-30 DIAGNOSIS — M25511 Pain in right shoulder: Secondary | ICD-10-CM | POA: Diagnosis not present

## 2016-06-30 DIAGNOSIS — M6281 Muscle weakness (generalized): Secondary | ICD-10-CM | POA: Diagnosis not present

## 2016-06-30 DIAGNOSIS — M25611 Stiffness of right shoulder, not elsewhere classified: Secondary | ICD-10-CM | POA: Diagnosis not present

## 2016-07-01 ENCOUNTER — Other Ambulatory Visit: Payer: Self-pay | Admitting: Obstetrics and Gynecology

## 2016-07-01 ENCOUNTER — Other Ambulatory Visit: Payer: Self-pay | Admitting: Internal Medicine

## 2016-07-01 DIAGNOSIS — Z1231 Encounter for screening mammogram for malignant neoplasm of breast: Secondary | ICD-10-CM

## 2016-07-02 DIAGNOSIS — M6281 Muscle weakness (generalized): Secondary | ICD-10-CM | POA: Diagnosis not present

## 2016-07-02 DIAGNOSIS — M25611 Stiffness of right shoulder, not elsewhere classified: Secondary | ICD-10-CM | POA: Diagnosis not present

## 2016-07-02 DIAGNOSIS — M25511 Pain in right shoulder: Secondary | ICD-10-CM | POA: Diagnosis not present

## 2016-07-09 DIAGNOSIS — M25511 Pain in right shoulder: Secondary | ICD-10-CM | POA: Diagnosis not present

## 2016-07-09 DIAGNOSIS — M6281 Muscle weakness (generalized): Secondary | ICD-10-CM | POA: Diagnosis not present

## 2016-07-09 DIAGNOSIS — M25611 Stiffness of right shoulder, not elsewhere classified: Secondary | ICD-10-CM | POA: Diagnosis not present

## 2016-07-14 DIAGNOSIS — M25511 Pain in right shoulder: Secondary | ICD-10-CM | POA: Diagnosis not present

## 2016-07-14 DIAGNOSIS — M25611 Stiffness of right shoulder, not elsewhere classified: Secondary | ICD-10-CM | POA: Diagnosis not present

## 2016-07-14 DIAGNOSIS — M6281 Muscle weakness (generalized): Secondary | ICD-10-CM | POA: Diagnosis not present

## 2016-07-16 DIAGNOSIS — M25511 Pain in right shoulder: Secondary | ICD-10-CM | POA: Diagnosis not present

## 2016-07-16 DIAGNOSIS — M6281 Muscle weakness (generalized): Secondary | ICD-10-CM | POA: Diagnosis not present

## 2016-07-16 DIAGNOSIS — M25611 Stiffness of right shoulder, not elsewhere classified: Secondary | ICD-10-CM | POA: Diagnosis not present

## 2016-07-24 ENCOUNTER — Ambulatory Visit (INDEPENDENT_AMBULATORY_CARE_PROVIDER_SITE_OTHER): Payer: Medicare Other

## 2016-07-24 DIAGNOSIS — Z1231 Encounter for screening mammogram for malignant neoplasm of breast: Secondary | ICD-10-CM | POA: Diagnosis not present

## 2016-07-24 DIAGNOSIS — F331 Major depressive disorder, recurrent, moderate: Secondary | ICD-10-CM | POA: Diagnosis not present

## 2016-07-30 ENCOUNTER — Other Ambulatory Visit: Payer: Self-pay | Admitting: Gastroenterology

## 2016-07-30 DIAGNOSIS — M6281 Muscle weakness (generalized): Secondary | ICD-10-CM | POA: Diagnosis not present

## 2016-07-30 DIAGNOSIS — M25511 Pain in right shoulder: Secondary | ICD-10-CM | POA: Diagnosis not present

## 2016-07-30 DIAGNOSIS — M25611 Stiffness of right shoulder, not elsewhere classified: Secondary | ICD-10-CM | POA: Diagnosis not present

## 2016-08-05 DIAGNOSIS — Z01419 Encounter for gynecological examination (general) (routine) without abnormal findings: Secondary | ICD-10-CM | POA: Diagnosis not present

## 2016-08-05 DIAGNOSIS — Z124 Encounter for screening for malignant neoplasm of cervix: Secondary | ICD-10-CM | POA: Diagnosis not present

## 2016-08-06 DIAGNOSIS — M6281 Muscle weakness (generalized): Secondary | ICD-10-CM | POA: Diagnosis not present

## 2016-08-06 DIAGNOSIS — M25511 Pain in right shoulder: Secondary | ICD-10-CM | POA: Diagnosis not present

## 2016-08-06 DIAGNOSIS — M25611 Stiffness of right shoulder, not elsewhere classified: Secondary | ICD-10-CM | POA: Diagnosis not present

## 2016-08-12 DIAGNOSIS — H5021 Vertical strabismus, right eye: Secondary | ICD-10-CM | POA: Diagnosis not present

## 2016-08-13 DIAGNOSIS — M6281 Muscle weakness (generalized): Secondary | ICD-10-CM | POA: Diagnosis not present

## 2016-08-13 DIAGNOSIS — M25511 Pain in right shoulder: Secondary | ICD-10-CM | POA: Diagnosis not present

## 2016-08-13 DIAGNOSIS — Z961 Presence of intraocular lens: Secondary | ICD-10-CM | POA: Diagnosis not present

## 2016-08-13 DIAGNOSIS — M25611 Stiffness of right shoulder, not elsewhere classified: Secondary | ICD-10-CM | POA: Diagnosis not present

## 2016-08-13 DIAGNOSIS — H532 Diplopia: Secondary | ICD-10-CM | POA: Diagnosis not present

## 2016-08-20 DIAGNOSIS — M6281 Muscle weakness (generalized): Secondary | ICD-10-CM | POA: Diagnosis not present

## 2016-08-20 DIAGNOSIS — M25511 Pain in right shoulder: Secondary | ICD-10-CM | POA: Diagnosis not present

## 2016-08-20 DIAGNOSIS — M25611 Stiffness of right shoulder, not elsewhere classified: Secondary | ICD-10-CM | POA: Diagnosis not present

## 2016-08-27 DIAGNOSIS — M6281 Muscle weakness (generalized): Secondary | ICD-10-CM | POA: Diagnosis not present

## 2016-08-27 DIAGNOSIS — M25611 Stiffness of right shoulder, not elsewhere classified: Secondary | ICD-10-CM | POA: Diagnosis not present

## 2016-08-27 DIAGNOSIS — M25511 Pain in right shoulder: Secondary | ICD-10-CM | POA: Diagnosis not present

## 2016-08-27 DIAGNOSIS — S42114D Nondisplaced fracture of body of scapula, right shoulder, subsequent encounter for fracture with routine healing: Secondary | ICD-10-CM | POA: Diagnosis not present

## 2016-09-02 DIAGNOSIS — M25511 Pain in right shoulder: Secondary | ICD-10-CM | POA: Diagnosis not present

## 2016-09-02 DIAGNOSIS — M25611 Stiffness of right shoulder, not elsewhere classified: Secondary | ICD-10-CM | POA: Diagnosis not present

## 2016-09-02 DIAGNOSIS — M6281 Muscle weakness (generalized): Secondary | ICD-10-CM | POA: Diagnosis not present

## 2016-09-08 ENCOUNTER — Encounter (HOSPITAL_COMMUNITY): Payer: Self-pay | Admitting: *Deleted

## 2016-09-11 DIAGNOSIS — F331 Major depressive disorder, recurrent, moderate: Secondary | ICD-10-CM | POA: Diagnosis not present

## 2016-09-14 ENCOUNTER — Ambulatory Visit (HOSPITAL_COMMUNITY): Payer: Medicare Other | Admitting: Certified Registered Nurse Anesthetist

## 2016-09-14 ENCOUNTER — Encounter (HOSPITAL_COMMUNITY): Payer: Self-pay | Admitting: Certified Registered Nurse Anesthetist

## 2016-09-14 ENCOUNTER — Encounter (HOSPITAL_COMMUNITY)
Admission: RE | Disposition: A | Discharge status: Home / Self Care | Payer: Self-pay | Source: Ambulatory Visit | Attending: Gastroenterology

## 2016-09-14 ENCOUNTER — Ambulatory Visit (HOSPITAL_COMMUNITY)
Admission: RE | Admit: 2016-09-14 | Discharge: 2016-09-14 | Disposition: A | Discharge status: Home / Self Care | Payer: Medicare Other | Source: Ambulatory Visit | Attending: Gastroenterology | Admitting: Gastroenterology

## 2016-09-14 DIAGNOSIS — M810 Age-related osteoporosis without current pathological fracture: Secondary | ICD-10-CM | POA: Diagnosis not present

## 2016-09-14 DIAGNOSIS — K219 Gastro-esophageal reflux disease without esophagitis: Secondary | ICD-10-CM | POA: Diagnosis not present

## 2016-09-14 DIAGNOSIS — Z1211 Encounter for screening for malignant neoplasm of colon: Secondary | ICD-10-CM | POA: Diagnosis not present

## 2016-09-14 DIAGNOSIS — F319 Bipolar disorder, unspecified: Secondary | ICD-10-CM | POA: Diagnosis not present

## 2016-09-14 DIAGNOSIS — Z79899 Other long term (current) drug therapy: Secondary | ICD-10-CM | POA: Insufficient documentation

## 2016-09-14 DIAGNOSIS — I10 Essential (primary) hypertension: Secondary | ICD-10-CM | POA: Diagnosis not present

## 2016-09-14 DIAGNOSIS — Z86010 Personal history of colonic polyps: Secondary | ICD-10-CM | POA: Insufficient documentation

## 2016-09-14 DIAGNOSIS — R51 Headache: Secondary | ICD-10-CM | POA: Diagnosis not present

## 2016-09-14 DIAGNOSIS — M797 Fibromyalgia: Secondary | ICD-10-CM | POA: Insufficient documentation

## 2016-09-14 DIAGNOSIS — Z85828 Personal history of other malignant neoplasm of skin: Secondary | ICD-10-CM | POA: Diagnosis not present

## 2016-09-14 DIAGNOSIS — G4730 Sleep apnea, unspecified: Secondary | ICD-10-CM | POA: Insufficient documentation

## 2016-09-14 DIAGNOSIS — K449 Diaphragmatic hernia without obstruction or gangrene: Secondary | ICD-10-CM | POA: Insufficient documentation

## 2016-09-14 DIAGNOSIS — E7800 Pure hypercholesterolemia, unspecified: Secondary | ICD-10-CM | POA: Diagnosis not present

## 2016-09-14 HISTORY — DX: Essential (primary) hypertension: I10

## 2016-09-14 HISTORY — DX: Headache: R51

## 2016-09-14 HISTORY — DX: Gastro-esophageal reflux disease without esophagitis: K21.9

## 2016-09-14 HISTORY — DX: Anxiety disorder, unspecified: F41.9

## 2016-09-14 HISTORY — DX: Headache, unspecified: R51.9

## 2016-09-14 HISTORY — PX: ESOPHAGOGASTRODUODENOSCOPY (EGD) WITH PROPOFOL: SHX5813

## 2016-09-14 HISTORY — DX: Unspecified dementia, unspecified severity, without behavioral disturbance, psychotic disturbance, mood disturbance, and anxiety: F03.90

## 2016-09-14 HISTORY — PX: COLONOSCOPY WITH PROPOFOL: SHX5780

## 2016-09-14 HISTORY — DX: Depression, unspecified: F32.A

## 2016-09-14 HISTORY — DX: Malignant (primary) neoplasm, unspecified: C80.1

## 2016-09-14 HISTORY — DX: Bipolar disorder, unspecified: F31.9

## 2016-09-14 HISTORY — DX: Major depressive disorder, single episode, unspecified: F32.9

## 2016-09-14 SURGERY — COLONOSCOPY WITH PROPOFOL
Anesthesia: Monitor Anesthesia Care

## 2016-09-14 MED ORDER — ONDANSETRON HCL 4 MG/2ML IJ SOLN
INTRAMUSCULAR | Status: AC
  Filled 2016-09-14: qty 2

## 2016-09-14 MED ORDER — ONDANSETRON HCL 4 MG/2ML IJ SOLN
INTRAMUSCULAR | Status: DC | PRN
  Administered 2016-09-14: 4 mg via INTRAVENOUS

## 2016-09-14 MED ORDER — PROPOFOL 10 MG/ML IV BOLUS
INTRAVENOUS | Status: DC | PRN
  Administered 2016-09-14 (×2): 20 mg via INTRAVENOUS
  Administered 2016-09-14: 40 mg via INTRAVENOUS
  Administered 2016-09-14: 20 mg via INTRAVENOUS
  Administered 2016-09-14: 50 mg via INTRAVENOUS
  Administered 2016-09-14: 40 mg via INTRAVENOUS

## 2016-09-14 MED ORDER — LIDOCAINE 2% (20 MG/ML) 5 ML SYRINGE
INTRAMUSCULAR | Status: AC
  Filled 2016-09-14: qty 5

## 2016-09-14 MED ORDER — LIDOCAINE 2% (20 MG/ML) 5 ML SYRINGE
INTRAMUSCULAR | Status: DC | PRN
  Administered 2016-09-14: 100 mg via INTRAVENOUS

## 2016-09-14 MED ORDER — LACTATED RINGERS IV SOLN
INTRAVENOUS | Status: DC | PRN
  Administered 2016-09-14: 10:00:00 via INTRAVENOUS

## 2016-09-14 MED ORDER — PROPOFOL 500 MG/50ML IV EMUL
INTRAVENOUS | Status: DC | PRN
  Administered 2016-09-14: 75 ug/kg/min via INTRAVENOUS

## 2016-09-14 MED ORDER — PROPOFOL 10 MG/ML IV BOLUS
INTRAVENOUS | Status: AC
  Filled 2016-09-14: qty 60

## 2016-09-14 SURGICAL SUPPLY — 24 items

## 2016-09-14 NOTE — H&P (Signed)
Procedure: Screening colonoscopy and diagnostic esophagogastroduodenoscopy. Chronic gastroesophageal reflux. History of colon polyps removed colonoscopically in the past. 08/05/2006 normal esophagogastroduodenoscopy was performed. 2007 [facility] GI evaluation performed to evaluate heartburn and chronic cough: Esophagogastroduodenoscopy, esophageal manometry, esophageal pH study were performed. 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  History: The patient is a 60 year old female born 1956/10/31. She has chronic gastroesophageal reflux which has worsened recently. She is scheduled to undergo a repeat screening colonoscopy and diagnostic esophagogastroduodenoscopy.  Past medical history: Hypertension. Hypercholesterolemia. Gastroesophageal reflux associated with a hiatal hernia. Fibromyalgia syndrome. Depression. Osteoporosis. Allergic rhinitis. Migraine headache syndrome. Tubal ligation. Tonsillectomy. Wrist surgery. Left knee surgery. Basal cell skin cancers removed. Bilateral cataract surgery.  Medication allergies: Sulfa drugs cause itching  Exam: The patient is alert and lying comfortably on the endoscopy stretcher. Abdomen is soft and nontender to palpation. Lungs are clear to auscultation. Cardiac exam reveals a regular rhythm.  Plan: Proceed with diagnostic esophagogastroduodenoscopy and screening colonoscopy

## 2016-09-14 NOTE — Op Note (Signed)
[facility] Patient Name: Lisa Jackson Procedure Date: 09/14/2016 MRN: [id number] Attending MD: Garlan Fair , MD Date of Birth: [birth year] CSN: [id number] Age: 60 Admit Type: Outpatient Procedure:                Colonoscopy Indications:              Screening for colorectal malignant neoplasm Providers:                Garlan Fair, MD, Cleda Daub, RN, Ralene Bathe, Technician, Adair Laundry, CRNA Referring MD:              Medicines:                Propofol per Anesthesia Complications:            No immediate complications. Estimated Blood Loss:     Estimated blood loss: none. Procedure:                Pre-Anesthesia Assessment:                           - Prior to the procedure, a History and Physical                            was performed, and patient medications and                            allergies were reviewed. The patient's tolerance of                            previous anesthesia was also reviewed. The risks                            and benefits of the procedure and the sedation                            options and risks were discussed with the patient.                            All questions were answered, and informed consent                            was obtained. Prior Anticoagulants: The patient has                            taken no previous anticoagulant or antiplatelet                            agents. ASA Grade Assessment: II - A patient with                            mild systemic disease. After reviewing the risks  and benefits, the patient was deemed in                            satisfactory condition to undergo the procedure.                           After obtaining informed consent, the colonoscope                            was passed under direct vision. Throughout the                            procedure, the patient's blood pressure, pulse, and                             oxygen saturations were monitored continuously. The                            [id number] scope was introduced through                            the anus and advanced to the the cecum, identified                            by appendiceal orifice and ileocecal valve. The                            colonoscopy was technically difficult and complex                            due to significant looping. The patient tolerated                            the procedure well. The quality of the bowel                            preparation was good. The appendiceal orifice and                            the rectum were photographed. Scope In: 10:21:52 AM Scope Out: 10:47:59 AM Scope Withdrawal Time: 0 hours 8 minutes 44 seconds  Total Procedure Duration: 0 hours 26 minutes 7 seconds  Findings:      The perianal and digital rectal examinations were normal.      The entire examined colon appeared normal. Impression:               - The entire examined colon is normal.                           - No specimens collected. Moderate Sedation:      N/A- Per Anesthesia Care Recommendation:           - Patient has a contact number available for  emergencies. The signs and symptoms of potential                            delayed complications were discussed with the                            patient. Return to normal activities tomorrow.                            Written discharge instructions were provided to the                            patient.                           - Repeat colonoscopy in 10 years for screening                            purposes.                           - Resume previous diet.                           - Continue present medications. Procedure Code(s):        --- Professional ---                           [id number], Colorectal cancer screening; colonoscopy on                            individual not meeting criteria for high  risk Diagnosis Code(s):        --- Professional ---                           Z12.11, Encounter for screening for malignant                            neoplasm of colon CPT copyright 2016 American Medical Association. All rights reserved. The codes documented in this report are preliminary and upon coder review may  be revised to meet current compliance requirements. Earle Gell, MD Garlan Fair, MD 09/14/2016 10:55:08 AM This report has been signed electronically. Number of Addenda: 0

## 2016-09-14 NOTE — Transfer of Care (Signed)
Immediate Anesthesia Transfer of Care Note  Patient: Lisa Jackson  Procedure(s) Performed: Procedure(s): COLONOSCOPY WITH PROPOFOL (N/A) ESOPHAGOGASTRODUODENOSCOPY (EGD) WITH PROPOFOL (N/A)  Patient Location: ENDO  Anesthesia Type:MAC  Level of Consciousness:  sedated, patient cooperative and responds to stimulation  Airway & Oxygen Therapy:Patient Spontanous Breathing and Patient connected to face mask oxgen  Post-op Assessment:  Report given to ENDO RN and Post -op Vital signs reviewed and stable  Post vital signs:  Reviewed and stable  Last Vitals:  Vitals:   09/14/16 1003  BP: 129/77  Pulse: 77  Resp: 15  Temp: [id number] C    Complications: No apparent anesthesia complications

## 2016-09-14 NOTE — Anesthesia Preprocedure Evaluation (Signed)
Anesthesia Evaluation  Patient identified by MRN, date of birth, ID band Patient awake    Reviewed: Allergy & Precautions, NPO status , Patient's Chart, lab work & pertinent test results  History of Anesthesia Complications Negative for: history of anesthetic complications  Airway Mallampati: II  TM Distance: >3 FB Neck ROM: Full    Dental  (+) Teeth Intact   Pulmonary sleep apnea ,    breath sounds clear to auscultation       Cardiovascular hypertension,  Rhythm:Regular     Neuro/Psych  Headaches, PSYCHIATRIC DISORDERS Anxiety Depression Bipolar Disorder    GI/Hepatic Neg liver ROS, GERD  Medicated,  Endo/Other  negative endocrine ROS  Renal/GU negative Renal ROS     Musculoskeletal   Abdominal   Peds  Hematology negative hematology ROS (+)   Anesthesia Other Findings   Reproductive/Obstetrics                             Anesthesia Physical Anesthesia Plan  ASA: II  Anesthesia Plan: MAC   Post-op Pain Management:    Induction: Intravenous  Airway Management Planned: Natural Airway, Nasal Cannula and Simple Face Mask  Additional Equipment: None  Intra-op Plan:   Post-operative Plan:   Informed Consent: I have reviewed the patients History and Physical, chart, labs and discussed the procedure including the risks, benefits and alternatives for the proposed anesthesia with the patient or authorized representative who has indicated his/her understanding and acceptance.   Dental advisory given  Plan Discussed with: CRNA and Surgeon  Anesthesia Plan Comments:         Anesthesia Quick Evaluation

## 2016-09-14 NOTE — Discharge Instructions (Signed)

## 2016-09-14 NOTE — Anesthesia Postprocedure Evaluation (Signed)
Anesthesia Post Note  Patient: Lisa Jackson  Procedure(s) Performed: Procedure(s) (LRB): COLONOSCOPY WITH PROPOFOL (N/A) ESOPHAGOGASTRODUODENOSCOPY (EGD) WITH PROPOFOL (N/A)  Patient location during evaluation: Endoscopy Anesthesia Type: MAC Level of consciousness: awake Pain management: pain level controlled Vital Signs Assessment: post-procedure vital signs reviewed and stable Respiratory status: spontaneous breathing Cardiovascular status: stable Postop Assessment: no signs of nausea or vomiting Anesthetic complications: no    Last Vitals:  Vitals:   09/14/16 1100 09/14/16 1120  BP:  116/81  Pulse:    Resp:    Temp: 36.4 C     Last Pain:  Vitals:   09/14/16 1100  TempSrc: Oral                 Elizeth Weinrich

## 2016-09-14 NOTE — Op Note (Signed)
[facility] Patient Name: Lisa Jackson Procedure Date: 09/14/2016 MRN: [id number] Attending MD: Garlan Fair , MD Date of Birth: [birth year] CSN: [id number] Age: 60 Admit Type: Outpatient Procedure:                Upper GI endoscopy Indications:              Heartburn Providers:                Garlan Fair, MD, Cleda Daub, RN, Ralene Bathe, Technician, Adair Laundry, CRNA Referring MD:              Medicines:                Propofol per Anesthesia Complications:            No immediate complications. Estimated Blood Loss:     Estimated blood loss: none. Procedure:                Pre-Anesthesia Assessment:                           - Prior to the procedure, a History and Physical                            was performed, and patient medications and                            allergies were reviewed. The patient's tolerance of                            previous anesthesia was also reviewed. The risks                            and benefits of the procedure and the sedation                            options and risks were discussed with the patient.                            All questions were answered, and informed consent                            was obtained. Prior Anticoagulants: The patient has                            taken no previous anticoagulant or antiplatelet                            agents. ASA Grade Assessment: II - A patient with                            mild systemic disease. After reviewing the risks  and benefits, the patient was deemed in                            satisfactory condition to undergo the procedure.                           After obtaining informed consent, the endoscope was                            passed under direct vision. Throughout the                            procedure, the patient's blood pressure, pulse, and                            oxygen  saturations were monitored continuously. The                            [id number] scope was introduced through the                            mouth, and advanced to the second part of duodenum.                            The upper GI endoscopy was accomplished without                            difficulty. The patient tolerated the procedure                            well. Scope In: Scope Out: Findings:      The Z-line was regular and was found 33 cm from the incisors.      The examined esophagus was normal.      A medium-sized hiatal hernia was present.      The stomach was normal.      The examined duodenum was normal. Impression:               - Z-line regular, 33 cm from the incisors.                           - Normal esophagus.                           - Medium-sized hiatal hernia.                           - Normal stomach.                           - Normal examined duodenum.                           - No specimens collected. Moderate Sedation:      N/A- Per Anesthesia Care Recommendation:           - Patient has a contact number  available for                            emergencies. The signs and symptoms of potential                            delayed complications were discussed with the                            patient. Return to normal activities tomorrow.                            Written discharge instructions were provided to the                            patient.                           - Return to primary care physician PRN.                           - Resume previous diet.                           - Continue present medications. Procedure Code(s):        --- Professional ---                           [phone], Esophagogastroduodenoscopy, flexible,                            transoral; diagnostic, including collection of                            specimen(s) by brushing or washing, when performed                            (separate  procedure) Diagnosis Code(s):        --- Professional ---                           K44.9, Diaphragmatic hernia without obstruction or                            gangrene                           R12, Heartburn CPT copyright 2016 American Medical Association. All rights reserved. The codes documented in this report are preliminary and upon coder review may  be revised to meet current compliance requirements. Earle Gell, MD Garlan Fair, MD 09/14/2016 10:58:35 AM This report has been signed electronically. Number of Addenda: 0

## 2016-09-16 ENCOUNTER — Encounter (HOSPITAL_COMMUNITY): Payer: Self-pay | Admitting: Gastroenterology

## 2016-09-17 DIAGNOSIS — R2689 Other abnormalities of gait and mobility: Secondary | ICD-10-CM | POA: Diagnosis not present

## 2016-09-17 DIAGNOSIS — M6281 Muscle weakness (generalized): Secondary | ICD-10-CM | POA: Diagnosis not present

## 2016-09-17 DIAGNOSIS — R262 Difficulty in walking, not elsewhere classified: Secondary | ICD-10-CM | POA: Diagnosis not present

## 2016-09-17 DIAGNOSIS — R279 Unspecified lack of coordination: Secondary | ICD-10-CM | POA: Diagnosis not present

## 2016-09-22 DIAGNOSIS — M6281 Muscle weakness (generalized): Secondary | ICD-10-CM | POA: Diagnosis not present

## 2016-09-22 DIAGNOSIS — R279 Unspecified lack of coordination: Secondary | ICD-10-CM | POA: Diagnosis not present

## 2016-09-22 DIAGNOSIS — R262 Difficulty in walking, not elsewhere classified: Secondary | ICD-10-CM | POA: Diagnosis not present

## 2016-09-22 DIAGNOSIS — R2689 Other abnormalities of gait and mobility: Secondary | ICD-10-CM | POA: Diagnosis not present

## 2016-09-24 DIAGNOSIS — R262 Difficulty in walking, not elsewhere classified: Secondary | ICD-10-CM | POA: Diagnosis not present

## 2016-09-24 DIAGNOSIS — R279 Unspecified lack of coordination: Secondary | ICD-10-CM | POA: Diagnosis not present

## 2016-09-24 DIAGNOSIS — M6281 Muscle weakness (generalized): Secondary | ICD-10-CM | POA: Diagnosis not present

## 2016-09-24 DIAGNOSIS — R2689 Other abnormalities of gait and mobility: Secondary | ICD-10-CM | POA: Diagnosis not present

## 2016-09-29 DIAGNOSIS — R279 Unspecified lack of coordination: Secondary | ICD-10-CM | POA: Diagnosis not present

## 2016-09-29 DIAGNOSIS — R2689 Other abnormalities of gait and mobility: Secondary | ICD-10-CM | POA: Diagnosis not present

## 2016-09-29 DIAGNOSIS — M6281 Muscle weakness (generalized): Secondary | ICD-10-CM | POA: Diagnosis not present

## 2016-10-01 DIAGNOSIS — M6281 Muscle weakness (generalized): Secondary | ICD-10-CM | POA: Diagnosis not present

## 2016-10-01 DIAGNOSIS — R262 Difficulty in walking, not elsewhere classified: Secondary | ICD-10-CM | POA: Diagnosis not present

## 2016-10-01 DIAGNOSIS — R2689 Other abnormalities of gait and mobility: Secondary | ICD-10-CM | POA: Diagnosis not present

## 2016-10-01 DIAGNOSIS — R279 Unspecified lack of coordination: Secondary | ICD-10-CM | POA: Diagnosis not present

## 2016-10-06 DIAGNOSIS — R279 Unspecified lack of coordination: Secondary | ICD-10-CM | POA: Diagnosis not present

## 2016-10-06 DIAGNOSIS — R262 Difficulty in walking, not elsewhere classified: Secondary | ICD-10-CM | POA: Diagnosis not present

## 2016-10-06 DIAGNOSIS — M6281 Muscle weakness (generalized): Secondary | ICD-10-CM | POA: Diagnosis not present

## 2016-10-08 DIAGNOSIS — R262 Difficulty in walking, not elsewhere classified: Secondary | ICD-10-CM | POA: Diagnosis not present

## 2016-10-08 DIAGNOSIS — R279 Unspecified lack of coordination: Secondary | ICD-10-CM | POA: Diagnosis not present

## 2016-10-08 DIAGNOSIS — M6281 Muscle weakness (generalized): Secondary | ICD-10-CM | POA: Diagnosis not present

## 2016-10-15 DIAGNOSIS — M6281 Muscle weakness (generalized): Secondary | ICD-10-CM | POA: Diagnosis not present

## 2016-10-15 DIAGNOSIS — R279 Unspecified lack of coordination: Secondary | ICD-10-CM | POA: Diagnosis not present

## 2016-10-15 DIAGNOSIS — R2689 Other abnormalities of gait and mobility: Secondary | ICD-10-CM | POA: Diagnosis not present

## 2016-10-17 DIAGNOSIS — Z23 Encounter for immunization: Secondary | ICD-10-CM | POA: Diagnosis not present

## 2016-10-20 DIAGNOSIS — R262 Difficulty in walking, not elsewhere classified: Secondary | ICD-10-CM | POA: Diagnosis not present

## 2016-10-20 DIAGNOSIS — R279 Unspecified lack of coordination: Secondary | ICD-10-CM | POA: Diagnosis not present

## 2016-10-20 DIAGNOSIS — R2689 Other abnormalities of gait and mobility: Secondary | ICD-10-CM | POA: Diagnosis not present

## 2016-10-20 DIAGNOSIS — M6281 Muscle weakness (generalized): Secondary | ICD-10-CM | POA: Diagnosis not present

## 2016-10-21 DIAGNOSIS — R350 Frequency of micturition: Secondary | ICD-10-CM | POA: Diagnosis not present

## 2016-10-22 DIAGNOSIS — M6281 Muscle weakness (generalized): Secondary | ICD-10-CM | POA: Diagnosis not present

## 2016-10-22 DIAGNOSIS — R2689 Other abnormalities of gait and mobility: Secondary | ICD-10-CM | POA: Diagnosis not present

## 2016-10-22 DIAGNOSIS — R279 Unspecified lack of coordination: Secondary | ICD-10-CM | POA: Diagnosis not present

## 2016-10-22 DIAGNOSIS — R262 Difficulty in walking, not elsewhere classified: Secondary | ICD-10-CM | POA: Diagnosis not present

## 2016-10-26 DIAGNOSIS — D225 Melanocytic nevi of trunk: Secondary | ICD-10-CM | POA: Diagnosis not present

## 2016-10-26 DIAGNOSIS — D2372 Other benign neoplasm of skin of left lower limb, including hip: Secondary | ICD-10-CM | POA: Diagnosis not present

## 2016-10-26 DIAGNOSIS — L821 Other seborrheic keratosis: Secondary | ICD-10-CM | POA: Diagnosis not present

## 2016-10-26 DIAGNOSIS — L578 Other skin changes due to chronic exposure to nonionizing radiation: Secondary | ICD-10-CM | POA: Diagnosis not present

## 2016-10-26 DIAGNOSIS — L03011 Cellulitis of right finger: Secondary | ICD-10-CM | POA: Diagnosis not present

## 2016-10-26 DIAGNOSIS — Z85828 Personal history of other malignant neoplasm of skin: Secondary | ICD-10-CM | POA: Diagnosis not present

## 2016-10-26 DIAGNOSIS — L0889 Other specified local infections of the skin and subcutaneous tissue: Secondary | ICD-10-CM | POA: Diagnosis not present

## 2016-10-26 DIAGNOSIS — L812 Freckles: Secondary | ICD-10-CM | POA: Diagnosis not present

## 2016-10-30 DIAGNOSIS — F331 Major depressive disorder, recurrent, moderate: Secondary | ICD-10-CM | POA: Diagnosis not present

## 2016-11-05 DIAGNOSIS — R2689 Other abnormalities of gait and mobility: Secondary | ICD-10-CM | POA: Diagnosis not present

## 2016-11-05 DIAGNOSIS — R262 Difficulty in walking, not elsewhere classified: Secondary | ICD-10-CM | POA: Diagnosis not present

## 2016-11-05 DIAGNOSIS — M6281 Muscle weakness (generalized): Secondary | ICD-10-CM | POA: Diagnosis not present

## 2016-11-09 DIAGNOSIS — R262 Difficulty in walking, not elsewhere classified: Secondary | ICD-10-CM | POA: Diagnosis not present

## 2016-11-09 DIAGNOSIS — R2689 Other abnormalities of gait and mobility: Secondary | ICD-10-CM | POA: Diagnosis not present

## 2016-11-09 DIAGNOSIS — M6281 Muscle weakness (generalized): Secondary | ICD-10-CM | POA: Diagnosis not present

## 2016-11-09 DIAGNOSIS — R279 Unspecified lack of coordination: Secondary | ICD-10-CM | POA: Diagnosis not present

## 2016-11-10 DIAGNOSIS — H5021 Vertical strabismus, right eye: Secondary | ICD-10-CM | POA: Diagnosis not present

## 2016-11-10 DIAGNOSIS — H532 Diplopia: Secondary | ICD-10-CM | POA: Diagnosis not present

## 2016-11-10 DIAGNOSIS — Z9889 Other specified postprocedural states: Secondary | ICD-10-CM | POA: Diagnosis not present

## 2016-11-11 DIAGNOSIS — R262 Difficulty in walking, not elsewhere classified: Secondary | ICD-10-CM | POA: Diagnosis not present

## 2016-11-11 DIAGNOSIS — R279 Unspecified lack of coordination: Secondary | ICD-10-CM | POA: Diagnosis not present

## 2016-11-11 DIAGNOSIS — M6281 Muscle weakness (generalized): Secondary | ICD-10-CM | POA: Diagnosis not present

## 2016-11-11 DIAGNOSIS — R2689 Other abnormalities of gait and mobility: Secondary | ICD-10-CM | POA: Diagnosis not present

## 2016-11-16 DIAGNOSIS — M6281 Muscle weakness (generalized): Secondary | ICD-10-CM | POA: Diagnosis not present

## 2016-11-16 DIAGNOSIS — R2689 Other abnormalities of gait and mobility: Secondary | ICD-10-CM | POA: Diagnosis not present

## 2016-11-16 DIAGNOSIS — R262 Difficulty in walking, not elsewhere classified: Secondary | ICD-10-CM | POA: Diagnosis not present

## 2016-11-16 DIAGNOSIS — R279 Unspecified lack of coordination: Secondary | ICD-10-CM | POA: Diagnosis not present

## 2016-11-18 DIAGNOSIS — R2689 Other abnormalities of gait and mobility: Secondary | ICD-10-CM | POA: Diagnosis not present

## 2016-11-18 DIAGNOSIS — R279 Unspecified lack of coordination: Secondary | ICD-10-CM | POA: Diagnosis not present

## 2016-11-18 DIAGNOSIS — M6281 Muscle weakness (generalized): Secondary | ICD-10-CM | POA: Diagnosis not present

## 2016-11-19 DIAGNOSIS — J329 Chronic sinusitis, unspecified: Secondary | ICD-10-CM | POA: Diagnosis not present

## 2016-11-19 DIAGNOSIS — J040 Acute laryngitis: Secondary | ICD-10-CM | POA: Diagnosis not present

## 2016-11-23 DIAGNOSIS — S63502A Unspecified sprain of left wrist, initial encounter: Secondary | ICD-10-CM | POA: Diagnosis not present

## 2016-11-23 DIAGNOSIS — S6992XA Unspecified injury of left wrist, hand and finger(s), initial encounter: Secondary | ICD-10-CM | POA: Diagnosis not present

## 2016-11-24 DIAGNOSIS — R279 Unspecified lack of coordination: Secondary | ICD-10-CM | POA: Diagnosis not present

## 2016-11-24 DIAGNOSIS — M6281 Muscle weakness (generalized): Secondary | ICD-10-CM | POA: Diagnosis not present

## 2016-11-24 DIAGNOSIS — R262 Difficulty in walking, not elsewhere classified: Secondary | ICD-10-CM | POA: Diagnosis not present

## 2016-12-03 DIAGNOSIS — R2689 Other abnormalities of gait and mobility: Secondary | ICD-10-CM | POA: Diagnosis not present

## 2016-12-03 DIAGNOSIS — M6281 Muscle weakness (generalized): Secondary | ICD-10-CM | POA: Diagnosis not present

## 2016-12-03 DIAGNOSIS — R279 Unspecified lack of coordination: Secondary | ICD-10-CM | POA: Diagnosis not present

## 2016-12-09 DIAGNOSIS — G259 Extrapyramidal and movement disorder, unspecified: Secondary | ICD-10-CM | POA: Diagnosis not present

## 2016-12-09 DIAGNOSIS — I10 Essential (primary) hypertension: Secondary | ICD-10-CM | POA: Diagnosis not present

## 2016-12-09 DIAGNOSIS — M810 Age-related osteoporosis without current pathological fracture: Secondary | ICD-10-CM | POA: Diagnosis not present

## 2016-12-09 DIAGNOSIS — K219 Gastro-esophageal reflux disease without esophagitis: Secondary | ICD-10-CM | POA: Diagnosis not present

## 2016-12-09 DIAGNOSIS — E7800 Pure hypercholesterolemia, unspecified: Secondary | ICD-10-CM | POA: Diagnosis not present

## 2016-12-09 DIAGNOSIS — F322 Major depressive disorder, single episode, severe without psychotic features: Secondary | ICD-10-CM | POA: Diagnosis not present

## 2016-12-18 IMAGING — MG MM DIGITAL SCREENING
8 series · 8 of 8 positions shown · non-contrast
Comparison: Previous exam(s).

CLINICAL DATA: Screening.

EXAM:
DIGITAL SCREENING BILATERAL MAMMOGRAM WITH CAD

[R CC (1 of 2)]
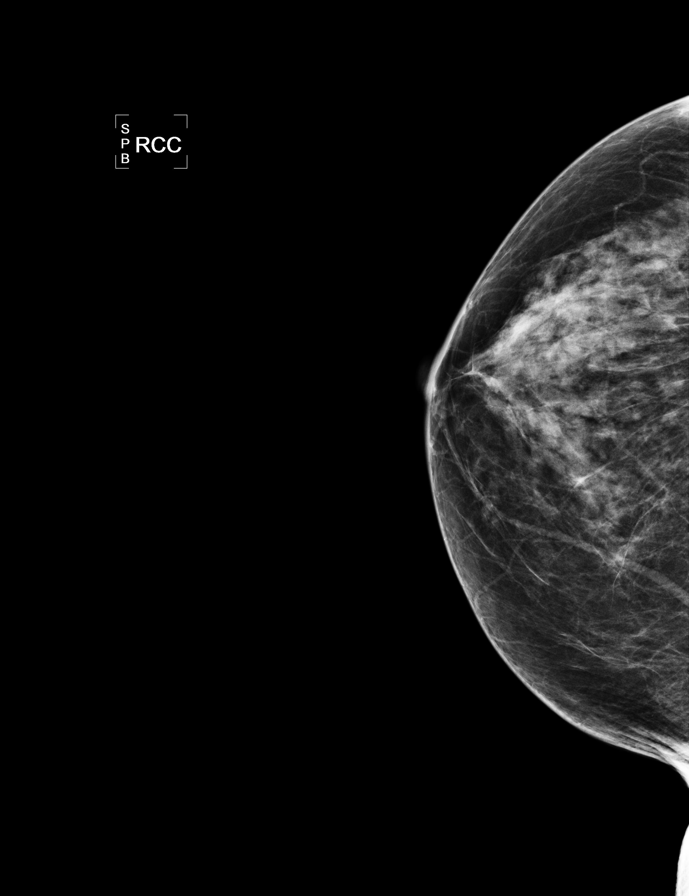

[L CC (1 of 2)]
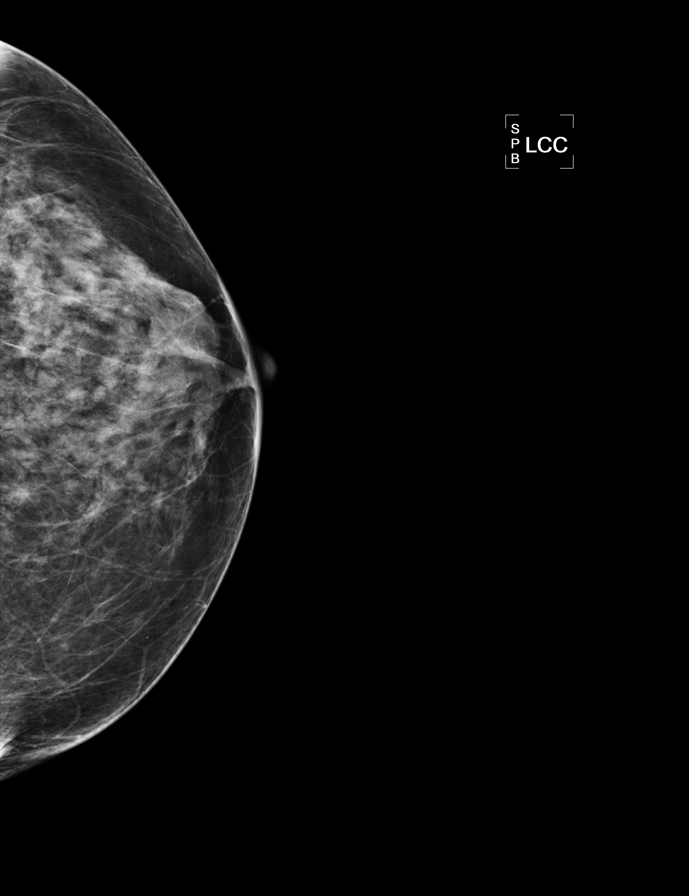

[L MLO (1 of 2)]
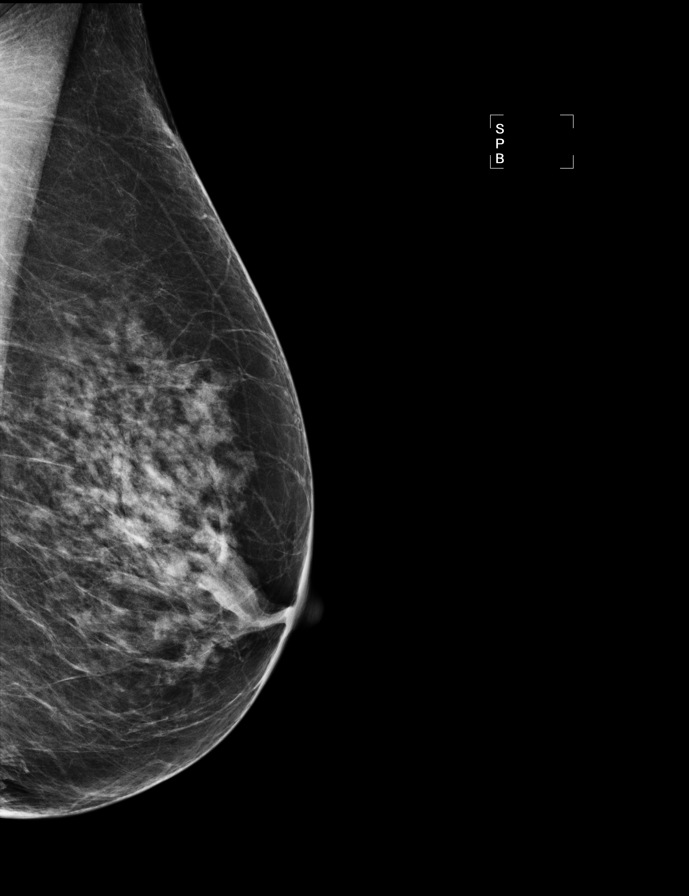

[R MLO]
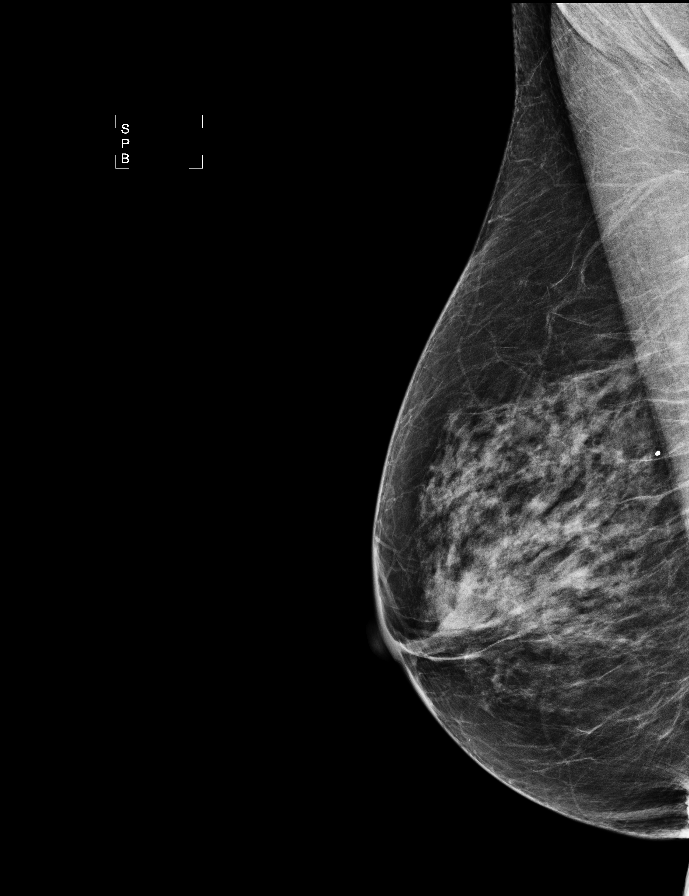

[L MLO (2 of 2)]
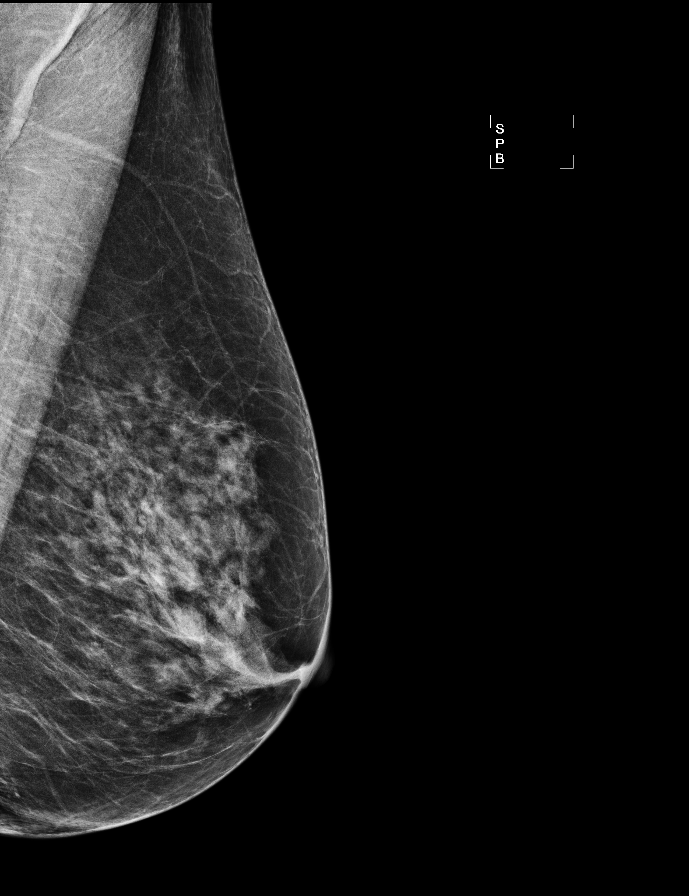

[L CC (2 of 2)]
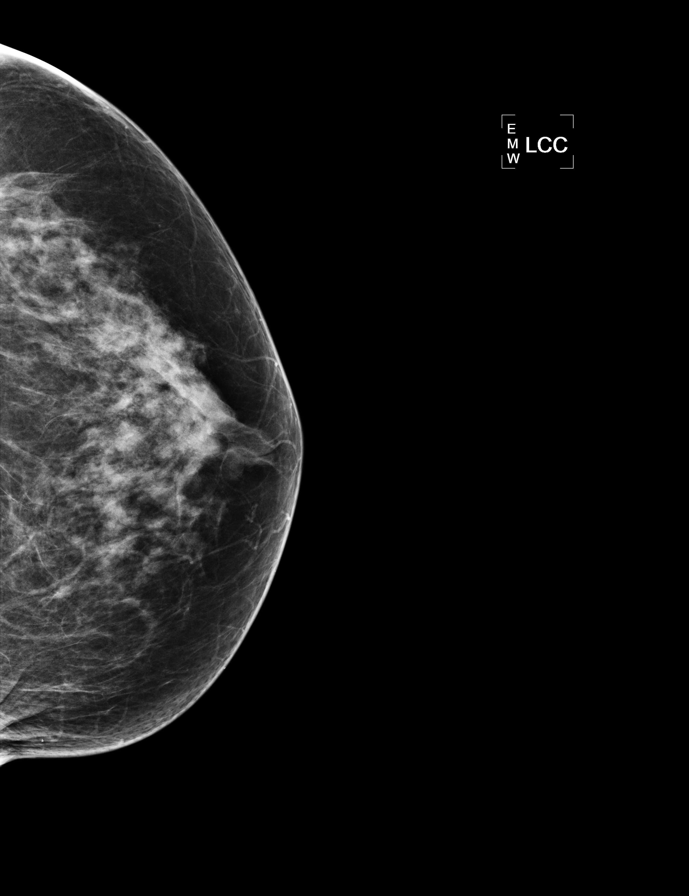

[R CC (2 of 2)]
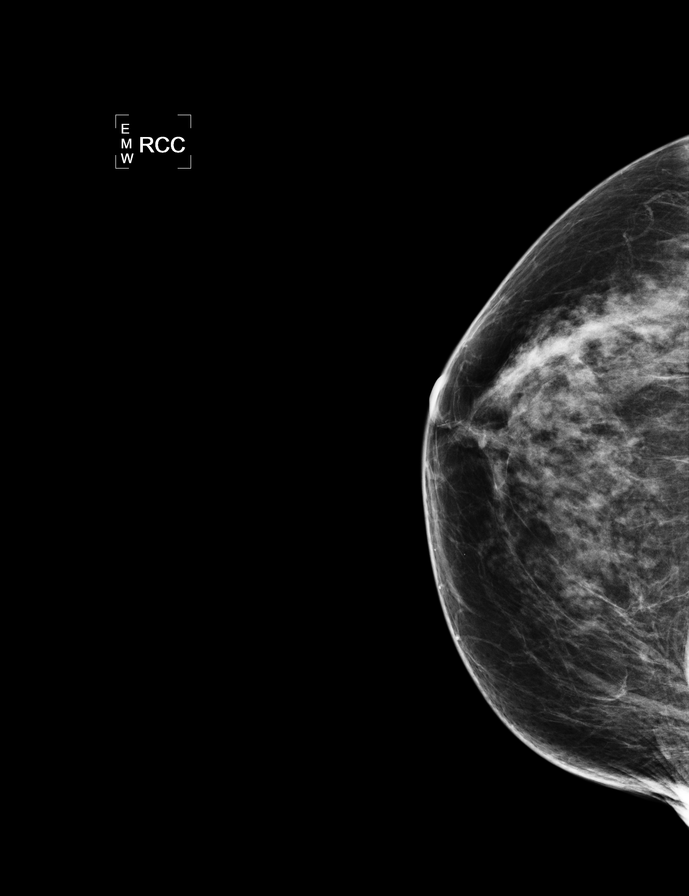

[R XCCL]
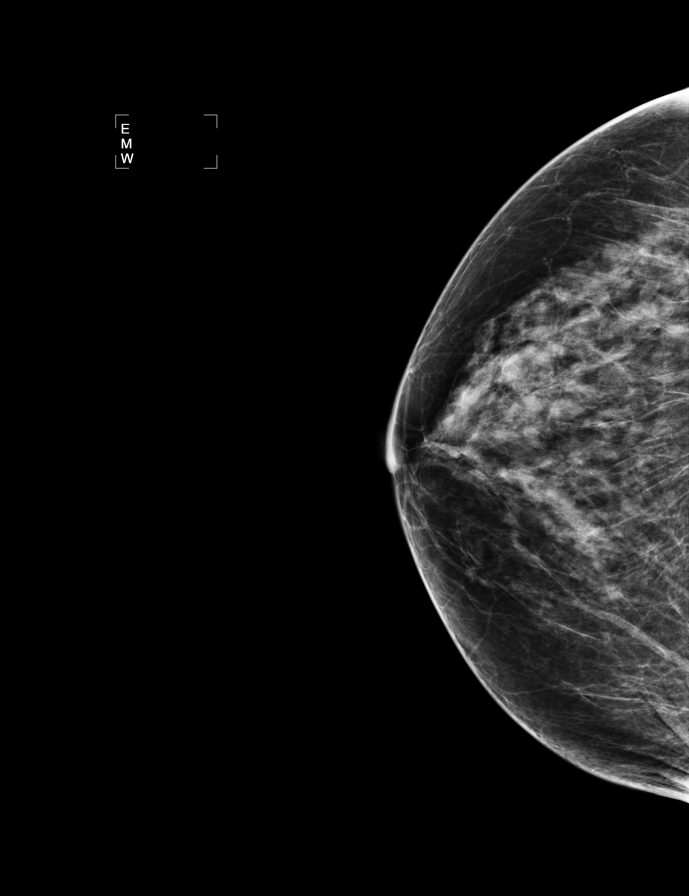

[8 of 8 positions shown; findings below may reference images not displayed]

ACR Breast Density Category c: The breast tissue is heterogeneously
dense, which may obscure small masses.
FINDINGS: There are no findings suspicious for malignancy. Images were
processed with CAD.
IMPRESSION: No mammographic evidence of malignancy. A result letter of this
screening mammogram will be mailed directly to the patient.

RECOMMENDATION:
Screening mammogram in one year. (Code:YJ-2-FEZ)

BI-RADS CATEGORY  1: Negative.

## 2016-12-28 DIAGNOSIS — H532 Diplopia: Secondary | ICD-10-CM | POA: Diagnosis not present

## 2016-12-28 DIAGNOSIS — H5021 Vertical strabismus, right eye: Secondary | ICD-10-CM | POA: Diagnosis not present

## 2016-12-28 DIAGNOSIS — I10 Essential (primary) hypertension: Secondary | ICD-10-CM | POA: Diagnosis not present

## 2016-12-28 DIAGNOSIS — E785 Hyperlipidemia, unspecified: Secondary | ICD-10-CM | POA: Diagnosis not present

## 2016-12-28 DIAGNOSIS — Z79899 Other long term (current) drug therapy: Secondary | ICD-10-CM | POA: Diagnosis not present

## 2016-12-28 DIAGNOSIS — F419 Anxiety disorder, unspecified: Secondary | ICD-10-CM | POA: Diagnosis not present

## 2016-12-28 DIAGNOSIS — F329 Major depressive disorder, single episode, unspecified: Secondary | ICD-10-CM | POA: Diagnosis not present

## 2016-12-28 DIAGNOSIS — H5201 Hypermetropia, right eye: Secondary | ICD-10-CM | POA: Diagnosis not present

## 2016-12-28 DIAGNOSIS — K219 Gastro-esophageal reflux disease without esophagitis: Secondary | ICD-10-CM | POA: Diagnosis not present

## 2017-01-02 DIAGNOSIS — J00 Acute nasopharyngitis [common cold]: Secondary | ICD-10-CM | POA: Diagnosis not present

## 2017-01-05 DIAGNOSIS — Z9889 Other specified postprocedural states: Secondary | ICD-10-CM | POA: Diagnosis not present

## 2017-01-11 DIAGNOSIS — R05 Cough: Secondary | ICD-10-CM | POA: Diagnosis not present

## 2017-01-11 DIAGNOSIS — J209 Acute bronchitis, unspecified: Secondary | ICD-10-CM | POA: Diagnosis not present

## 2017-01-15 DIAGNOSIS — F331 Major depressive disorder, recurrent, moderate: Secondary | ICD-10-CM | POA: Diagnosis not present

## 2017-01-19 DIAGNOSIS — G255 Other chorea: Secondary | ICD-10-CM | POA: Diagnosis not present

## 2017-01-19 DIAGNOSIS — G2581 Restless legs syndrome: Secondary | ICD-10-CM | POA: Diagnosis not present

## 2017-01-19 DIAGNOSIS — R4189 Other symptoms and signs involving cognitive functions and awareness: Secondary | ICD-10-CM | POA: Diagnosis not present

## 2017-01-19 DIAGNOSIS — R413 Other amnesia: Secondary | ICD-10-CM | POA: Diagnosis not present

## 2017-02-24 DIAGNOSIS — R413 Other amnesia: Secondary | ICD-10-CM | POA: Diagnosis not present

## 2017-03-08 ENCOUNTER — Encounter: Payer: Self-pay | Admitting: Sports Medicine

## 2017-03-08 ENCOUNTER — Ambulatory Visit (INDEPENDENT_AMBULATORY_CARE_PROVIDER_SITE_OTHER): Payer: Medicare Other | Admitting: Sports Medicine

## 2017-03-08 VITALS — BP 148/82 | HR 77 | Ht 61.0 in | Wt 127.0 lb

## 2017-03-08 DIAGNOSIS — Q667 Congenital pes cavus, unspecified foot: Secondary | ICD-10-CM

## 2017-03-08 DIAGNOSIS — M25569 Pain in unspecified knee: Secondary | ICD-10-CM | POA: Diagnosis present

## 2017-03-08 NOTE — Progress Notes (Signed)
   Subjective:    Patient ID: Lisa Jackson, female    DOB: December 15, 1956, 61 y.o.   MRN: [id number]  HPI chief complaint "I need new orthotics"  Very pleasant 61 year old female comes in today for a new pair of orthotics. Last orthotics were constructed in November 2016. She has done very well with them. Unfortunately, she fell fracturing her right scapula several months ago. She was treated nonoperatively by Dr. Noemi Chapel and has made a full recovery. As a result of that injury she was not able to run her half marathon. However, she continues to be very active and enjoys running.  Interim medical history reviewed Medications reviewed Allergies reviewed     Review of Systems    as above Objective:   Physical Exam  Well-developed, well-nourished. No acute distress. Sitting Up in the exam room.  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 She also has hindfoot varus and splaying of the first, second, and third toes. No soft tissue swelling. Neurovascularly intact distally. She walks with a little bit of an unsteady gait.      Assessment & Plan:   Cavus foot  New orthotics were constructed today. Patient found them to be comfortable prior to leaving the office. Total of 30 minutes was spent with the patient with greater than 50% of the time spent in face-to-face consultation discussing orthotic construction, instruction, and fitting. Please note that today's orthotics were a size 7 which fit more comfortably in her current tennis shoes than a size 8 (size 8 was created for her in November 2016).  Patient was fitted for a : standard, cushioned, semi-rigid orthotic. The orthotic was heated and afterward the patient stood on the orthotic blank positioned on the orthotic stand. The patient was positioned in subtalar neutral position and 10 degrees of ankle dorsiflexion in a weight bearing stance. After completion of molding, a stable base was applied to the orthotic  blank. The blank was ground to a stable position for weight bearing. Size: 7 Base: blue EVA Posting: none Additional orthotic padding: none

## 2017-04-01 DIAGNOSIS — R109 Unspecified abdominal pain: Secondary | ICD-10-CM | POA: Diagnosis not present

## 2017-04-01 DIAGNOSIS — R5383 Other fatigue: Secondary | ICD-10-CM | POA: Diagnosis not present

## 2017-04-02 DIAGNOSIS — F331 Major depressive disorder, recurrent, moderate: Secondary | ICD-10-CM | POA: Diagnosis not present

## 2017-04-19 DIAGNOSIS — R4189 Other symptoms and signs involving cognitive functions and awareness: Secondary | ICD-10-CM | POA: Diagnosis not present

## 2017-04-19 DIAGNOSIS — G255 Other chorea: Secondary | ICD-10-CM | POA: Diagnosis not present

## 2017-04-19 DIAGNOSIS — R413 Other amnesia: Secondary | ICD-10-CM | POA: Diagnosis not present

## 2017-04-22 DIAGNOSIS — R109 Unspecified abdominal pain: Secondary | ICD-10-CM | POA: Diagnosis not present

## 2017-04-22 DIAGNOSIS — R3129 Other microscopic hematuria: Secondary | ICD-10-CM | POA: Diagnosis not present

## 2017-04-26 DIAGNOSIS — Z85828 Personal history of other malignant neoplasm of skin: Secondary | ICD-10-CM | POA: Diagnosis not present

## 2017-04-26 DIAGNOSIS — L821 Other seborrheic keratosis: Secondary | ICD-10-CM | POA: Diagnosis not present

## 2017-04-26 DIAGNOSIS — L570 Actinic keratosis: Secondary | ICD-10-CM | POA: Diagnosis not present

## 2017-05-21 DIAGNOSIS — Z1389 Encounter for screening for other disorder: Secondary | ICD-10-CM | POA: Diagnosis not present

## 2017-05-21 DIAGNOSIS — G259 Extrapyramidal and movement disorder, unspecified: Secondary | ICD-10-CM | POA: Diagnosis not present

## 2017-05-21 DIAGNOSIS — G43909 Migraine, unspecified, not intractable, without status migrainosus: Secondary | ICD-10-CM | POA: Diagnosis not present

## 2017-05-21 DIAGNOSIS — G2581 Restless legs syndrome: Secondary | ICD-10-CM | POA: Diagnosis not present

## 2017-05-21 DIAGNOSIS — F0390 Unspecified dementia without behavioral disturbance: Secondary | ICD-10-CM | POA: Diagnosis not present

## 2017-05-21 DIAGNOSIS — F322 Major depressive disorder, single episode, severe without psychotic features: Secondary | ICD-10-CM | POA: Diagnosis not present

## 2017-05-21 DIAGNOSIS — I10 Essential (primary) hypertension: Secondary | ICD-10-CM | POA: Diagnosis not present

## 2017-05-21 DIAGNOSIS — M810 Age-related osteoporosis without current pathological fracture: Secondary | ICD-10-CM | POA: Diagnosis not present

## 2017-05-21 DIAGNOSIS — E7800 Pure hypercholesterolemia, unspecified: Secondary | ICD-10-CM | POA: Diagnosis not present

## 2017-05-21 DIAGNOSIS — K219 Gastro-esophageal reflux disease without esophagitis: Secondary | ICD-10-CM | POA: Diagnosis not present

## 2017-05-21 DIAGNOSIS — Z0000 Encounter for general adult medical examination without abnormal findings: Secondary | ICD-10-CM | POA: Diagnosis not present

## 2017-05-24 ENCOUNTER — Other Ambulatory Visit: Payer: Self-pay | Admitting: Obstetrics and Gynecology

## 2017-05-24 DIAGNOSIS — Z1231 Encounter for screening mammogram for malignant neoplasm of breast: Secondary | ICD-10-CM

## 2017-06-04 DIAGNOSIS — F331 Major depressive disorder, recurrent, moderate: Secondary | ICD-10-CM | POA: Diagnosis not present

## 2017-07-06 DIAGNOSIS — M25561 Pain in right knee: Secondary | ICD-10-CM | POA: Diagnosis not present

## 2017-07-06 DIAGNOSIS — S42114D Nondisplaced fracture of body of scapula, right shoulder, subsequent encounter for fracture with routine healing: Secondary | ICD-10-CM | POA: Diagnosis not present

## 2017-07-23 DIAGNOSIS — F331 Major depressive disorder, recurrent, moderate: Secondary | ICD-10-CM | POA: Diagnosis not present

## 2017-07-27 ENCOUNTER — Ambulatory Visit: Payer: Medicare Other

## 2017-08-04 ENCOUNTER — Ambulatory Visit (INDEPENDENT_AMBULATORY_CARE_PROVIDER_SITE_OTHER): Payer: Medicare Other

## 2017-08-04 DIAGNOSIS — Z1231 Encounter for screening mammogram for malignant neoplasm of breast: Secondary | ICD-10-CM

## 2017-08-10 DIAGNOSIS — S0990XA Unspecified injury of head, initial encounter: Secondary | ICD-10-CM | POA: Diagnosis not present

## 2017-08-10 DIAGNOSIS — Z888 Allergy status to other drugs, medicaments and biological substances status: Secondary | ICD-10-CM | POA: Diagnosis not present

## 2017-08-10 DIAGNOSIS — R51 Headache: Secondary | ICD-10-CM | POA: Diagnosis not present

## 2017-08-10 DIAGNOSIS — M4312 Spondylolisthesis, cervical region: Secondary | ICD-10-CM | POA: Diagnosis not present

## 2017-08-10 DIAGNOSIS — Z7951 Long term (current) use of inhaled steroids: Secondary | ICD-10-CM | POA: Diagnosis not present

## 2017-08-10 DIAGNOSIS — M2578 Osteophyte, vertebrae: Secondary | ICD-10-CM | POA: Diagnosis not present

## 2017-08-10 DIAGNOSIS — M542 Cervicalgia: Secondary | ICD-10-CM | POA: Diagnosis not present

## 2017-08-10 DIAGNOSIS — E785 Hyperlipidemia, unspecified: Secondary | ICD-10-CM | POA: Diagnosis not present

## 2017-08-10 DIAGNOSIS — Z79899 Other long term (current) drug therapy: Secondary | ICD-10-CM | POA: Diagnosis not present

## 2017-08-10 DIAGNOSIS — I10 Essential (primary) hypertension: Secondary | ICD-10-CM | POA: Diagnosis not present

## 2017-08-10 DIAGNOSIS — S199XXA Unspecified injury of neck, initial encounter: Secondary | ICD-10-CM | POA: Diagnosis not present

## 2017-08-10 DIAGNOSIS — M1990 Unspecified osteoarthritis, unspecified site: Secondary | ICD-10-CM | POA: Diagnosis not present

## 2017-08-10 DIAGNOSIS — W108XXA Fall (on) (from) other stairs and steps, initial encounter: Secondary | ICD-10-CM | POA: Diagnosis not present

## 2017-08-10 DIAGNOSIS — Z882 Allergy status to sulfonamides status: Secondary | ICD-10-CM | POA: Diagnosis not present

## 2017-08-10 DIAGNOSIS — F0390 Unspecified dementia without behavioral disturbance: Secondary | ICD-10-CM | POA: Diagnosis not present

## 2017-08-11 DIAGNOSIS — Z124 Encounter for screening for malignant neoplasm of cervix: Secondary | ICD-10-CM | POA: Diagnosis not present

## 2017-08-11 DIAGNOSIS — N952 Postmenopausal atrophic vaginitis: Secondary | ICD-10-CM | POA: Diagnosis not present

## 2017-08-18 DIAGNOSIS — F329 Major depressive disorder, single episode, unspecified: Secondary | ICD-10-CM | POA: Diagnosis not present

## 2017-08-18 DIAGNOSIS — G255 Other chorea: Secondary | ICD-10-CM | POA: Diagnosis not present

## 2017-08-18 DIAGNOSIS — R413 Other amnesia: Secondary | ICD-10-CM | POA: Diagnosis not present

## 2017-08-18 DIAGNOSIS — F411 Generalized anxiety disorder: Secondary | ICD-10-CM | POA: Diagnosis not present

## 2017-09-14 DIAGNOSIS — Z9889 Other specified postprocedural states: Secondary | ICD-10-CM | POA: Diagnosis not present

## 2017-09-14 DIAGNOSIS — H5022 Vertical strabismus, left eye: Secondary | ICD-10-CM | POA: Diagnosis not present

## 2017-09-17 DIAGNOSIS — F331 Major depressive disorder, recurrent, moderate: Secondary | ICD-10-CM | POA: Diagnosis not present

## 2017-10-18 ENCOUNTER — Ambulatory Visit (INDEPENDENT_AMBULATORY_CARE_PROVIDER_SITE_OTHER): Payer: Medicare Other | Admitting: Sports Medicine

## 2017-10-18 VITALS — BP 122/80 | Ht 61.0 in | Wt 129.0 lb

## 2017-10-18 DIAGNOSIS — M79671 Pain in right foot: Secondary | ICD-10-CM | POA: Diagnosis not present

## 2017-10-18 NOTE — Progress Notes (Signed)
   Subjective:    Patient ID: Lisa Jackson, female    DOB: [birth year], 61 y.o.   MRN: [id number]  HPI chief complaint: Right foot pain  Patient comes in today complaining of 6 weeks of medial right foot pain. Her pain is intermittent. She is an avid runner and enjoys exercise. On certain days she will notice pain and swelling that she localizes near the navicular. She has custom orthotics which are comfortable. She does admit to some pain with walking but denies limping. No numbness or tingling. No known trauma.    Review of Systems As above    Objective:   Physical Exam  Well-developed, well-nourished. No acute distress. Vital signs reviewed  Right foot: Patient has a cavus foot with forefoot adduction. She is tender to palpation over the navicular. No obvious swelling. No other tenderness to palpation. Neurovascularly intact distally.      Assessment & Plan:   Right foot pain worrisome for navicular stress fracture  Going to start with an x-ray of the right foot. If x-ray is inconclusive they'll proceed with an MRI scan specifically to rule out a navicular stress fracture. Phone follow-up with those results when available and we'll delineate further treatment based on those findings. In the meantime, I recommended that the patient decrease her exercise activity to those activities that are relatively pain free.

## 2017-10-19 NOTE — Addendum Note (Signed)
Addended by: Jolinda Croak E on: 10/19/2017 08:33 AM   Modules accepted: Orders

## 2017-10-21 ENCOUNTER — Ambulatory Visit
Admission: RE | Admit: 2017-10-21 | Discharge: 2017-10-21 | Disposition: A | Discharge status: Home / Self Care | Payer: Medicare Other | Source: Ambulatory Visit | Attending: Sports Medicine | Admitting: Sports Medicine

## 2017-10-21 DIAGNOSIS — M79671 Pain in right foot: Secondary | ICD-10-CM

## 2017-10-21 DIAGNOSIS — M19071 Primary osteoarthritis, right ankle and foot: Secondary | ICD-10-CM | POA: Diagnosis not present

## 2017-10-28 ENCOUNTER — Ambulatory Visit
Admission: RE | Admit: 2017-10-28 | Discharge: 2017-10-28 | Disposition: A | Discharge status: Home / Self Care | Payer: Medicare Other | Source: Ambulatory Visit | Attending: Sports Medicine | Admitting: Sports Medicine

## 2017-10-28 DIAGNOSIS — M79671 Pain in right foot: Secondary | ICD-10-CM

## 2017-10-28 DIAGNOSIS — M19071 Primary osteoarthritis, right ankle and foot: Secondary | ICD-10-CM | POA: Diagnosis not present

## 2017-10-29 DIAGNOSIS — B351 Tinea unguium: Secondary | ICD-10-CM | POA: Diagnosis not present

## 2017-10-29 DIAGNOSIS — D692 Other nonthrombocytopenic purpura: Secondary | ICD-10-CM | POA: Diagnosis not present

## 2017-10-29 DIAGNOSIS — L570 Actinic keratosis: Secondary | ICD-10-CM | POA: Diagnosis not present

## 2017-10-29 DIAGNOSIS — L820 Inflamed seborrheic keratosis: Secondary | ICD-10-CM | POA: Diagnosis not present

## 2017-10-29 DIAGNOSIS — L821 Other seborrheic keratosis: Secondary | ICD-10-CM | POA: Diagnosis not present

## 2017-10-29 DIAGNOSIS — Z85828 Personal history of other malignant neoplasm of skin: Secondary | ICD-10-CM | POA: Diagnosis not present

## 2017-10-29 DIAGNOSIS — L812 Freckles: Secondary | ICD-10-CM | POA: Diagnosis not present

## 2017-11-03 ENCOUNTER — Telehealth: Payer: Self-pay | Admitting: Sports Medicine

## 2017-11-03 NOTE — Telephone Encounter (Signed)
  Spoke with the patient's husband on the phone yesterday after reviewing the MRI of her right foot. No evidence of navicular stress fracture. 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 This is likely her pain generator. I recommended that the patient return to the office to be fitted with an arch strap at some point in the near future. She needs to continue with her custom orthotics when running. She may continue to run using pain as her guide.

## 2017-11-05 DIAGNOSIS — F331 Major depressive disorder, recurrent, moderate: Secondary | ICD-10-CM | POA: Diagnosis not present

## 2017-11-22 DIAGNOSIS — I10 Essential (primary) hypertension: Secondary | ICD-10-CM | POA: Diagnosis not present

## 2017-11-22 DIAGNOSIS — F0390 Unspecified dementia without behavioral disturbance: Secondary | ICD-10-CM | POA: Diagnosis not present

## 2017-11-22 DIAGNOSIS — M797 Fibromyalgia: Secondary | ICD-10-CM | POA: Diagnosis not present

## 2017-11-22 DIAGNOSIS — G259 Extrapyramidal and movement disorder, unspecified: Secondary | ICD-10-CM | POA: Diagnosis not present

## 2017-11-22 DIAGNOSIS — E7800 Pure hypercholesterolemia, unspecified: Secondary | ICD-10-CM | POA: Diagnosis not present

## 2017-11-22 DIAGNOSIS — M25571 Pain in right ankle and joints of right foot: Secondary | ICD-10-CM | POA: Diagnosis not present

## 2017-11-22 DIAGNOSIS — Z23 Encounter for immunization: Secondary | ICD-10-CM | POA: Diagnosis not present

## 2017-11-22 DIAGNOSIS — F322 Major depressive disorder, single episode, severe without psychotic features: Secondary | ICD-10-CM | POA: Diagnosis not present

## 2017-11-22 DIAGNOSIS — K219 Gastro-esophageal reflux disease without esophagitis: Secondary | ICD-10-CM | POA: Diagnosis not present

## 2017-11-22 DIAGNOSIS — M810 Age-related osteoporosis without current pathological fracture: Secondary | ICD-10-CM | POA: Diagnosis not present

## 2017-11-29 ENCOUNTER — Ambulatory Visit: Payer: Medicare Other | Admitting: Sports Medicine

## 2017-11-30 ENCOUNTER — Ambulatory Visit: Payer: Medicare Other | Admitting: Sports Medicine

## 2017-12-01 ENCOUNTER — Ambulatory Visit (INDEPENDENT_AMBULATORY_CARE_PROVIDER_SITE_OTHER): Payer: Medicare Other | Admitting: Sports Medicine

## 2017-12-01 VITALS — BP 122/84 | Ht 61.0 in | Wt 125.0 lb

## 2017-12-01 DIAGNOSIS — S92505A Nondisplaced unspecified fracture of left lesser toe(s), initial encounter for closed fracture: Secondary | ICD-10-CM

## 2017-12-01 DIAGNOSIS — M79671 Pain in right foot: Secondary | ICD-10-CM

## 2017-12-01 NOTE — Progress Notes (Signed)
   Subjective:    Patient ID: Lisa Jackson, female    DOB: 17-Sep-1956, 61 y.o.   MRN: [id number]  HPI  Patient comes in today for follow-up on right foot pain. Her foot pain persists. Arch strap has not been helpful. Recent MRI of her foot showed midfoot DJD but no stress fracture. She's been taking a couple of Advil twice a day without any real symptom relief. Since her last office visit, she stubbed her second toe on her left foot. She has acquired a postop shoe which has been helpful. She did have some bruising throughout the toe and foot which has since subsided. She's broken her toes in the past and states that her pain is similar to what she experienced back then. She is here today with her husband.    Review of Systems     as above  Objective:   Physical Exam  Well-developed, well-nourished. No acute distress   Right foot: Patient remains tender to palpation along the medial midfoot and hindfoot. No soft tissue swelling. Good ankle range of motion.  Left foot: There is swelling of the second digit diffusely. She does have limited flexion and extension but tendons appear to be intact. Diffuse tenderness to palpation. No obvious malalignment. Neurovascular intact distally.      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 The arch strap does not quite come up high enough on her foot. She'll continue with her custom orthotics in her running shoes and I've given her green sports insoles and scaphoid pads to use in other shoes. I recommended that she not go barefoot around the house. I've also recommended that she start applying Voltaren gel 2-3 times daily over the area of her foot and ankle that is sore. In regards to her left second toe, I did offer an x-ray but they refused. There is no obvious malalignment so I think the x-ray is  unnecessary. We will give her some Coban to buddy tape her first and second toes and she can wean from her postop shoe as symptoms allow. In regards to returning to running, she may return as her right foot pain and left second toe pain allow. She does understand that it may be several weeks before this is possible. Follow-up for ongoing or recalcitrant issues.

## 2017-12-31 DIAGNOSIS — F331 Major depressive disorder, recurrent, moderate: Secondary | ICD-10-CM | POA: Diagnosis not present

## 2018-01-18 DIAGNOSIS — F4481 Dissociative identity disorder: Secondary | ICD-10-CM | POA: Diagnosis not present

## 2018-01-18 DIAGNOSIS — M76821 Posterior tibial tendinitis, right leg: Secondary | ICD-10-CM | POA: Diagnosis not present

## 2018-01-18 DIAGNOSIS — F411 Generalized anxiety disorder: Secondary | ICD-10-CM | POA: Diagnosis not present

## 2018-01-18 DIAGNOSIS — F331 Major depressive disorder, recurrent, moderate: Secondary | ICD-10-CM | POA: Diagnosis not present

## 2018-01-18 DIAGNOSIS — M2042 Other hammer toe(s) (acquired), left foot: Secondary | ICD-10-CM | POA: Diagnosis not present

## 2018-01-18 DIAGNOSIS — M7581 Other shoulder lesions, right shoulder: Secondary | ICD-10-CM | POA: Diagnosis not present

## 2018-01-18 DIAGNOSIS — M810 Age-related osteoporosis without current pathological fracture: Secondary | ICD-10-CM | POA: Diagnosis not present

## 2018-02-21 DIAGNOSIS — R2689 Other abnormalities of gait and mobility: Secondary | ICD-10-CM | POA: Diagnosis not present

## 2018-02-21 DIAGNOSIS — Z915 Personal history of self-harm: Secondary | ICD-10-CM | POA: Diagnosis not present

## 2018-02-21 DIAGNOSIS — F329 Major depressive disorder, single episode, unspecified: Secondary | ICD-10-CM | POA: Diagnosis not present

## 2018-02-21 DIAGNOSIS — G255 Other chorea: Secondary | ICD-10-CM | POA: Diagnosis not present

## 2018-02-21 DIAGNOSIS — F411 Generalized anxiety disorder: Secondary | ICD-10-CM | POA: Diagnosis not present

## 2018-02-21 DIAGNOSIS — E786 Lipoprotein deficiency: Secondary | ICD-10-CM | POA: Diagnosis not present

## 2018-02-21 DIAGNOSIS — R296 Repeated falls: Secondary | ICD-10-CM | POA: Diagnosis not present

## 2018-02-21 DIAGNOSIS — R413 Other amnesia: Secondary | ICD-10-CM | POA: Diagnosis not present

## 2018-02-25 DIAGNOSIS — F331 Major depressive disorder, recurrent, moderate: Secondary | ICD-10-CM | POA: Diagnosis not present

## 2018-05-05 DIAGNOSIS — F331 Major depressive disorder, recurrent, moderate: Secondary | ICD-10-CM | POA: Diagnosis not present

## 2018-05-12 DIAGNOSIS — L812 Freckles: Secondary | ICD-10-CM | POA: Diagnosis not present

## 2018-05-12 DIAGNOSIS — L821 Other seborrheic keratosis: Secondary | ICD-10-CM | POA: Diagnosis not present

## 2018-05-12 DIAGNOSIS — Z85828 Personal history of other malignant neoplasm of skin: Secondary | ICD-10-CM | POA: Diagnosis not present

## 2018-05-12 DIAGNOSIS — L570 Actinic keratosis: Secondary | ICD-10-CM | POA: Diagnosis not present

## 2018-06-07 DIAGNOSIS — M810 Age-related osteoporosis without current pathological fracture: Secondary | ICD-10-CM | POA: Diagnosis not present

## 2018-06-07 DIAGNOSIS — G259 Extrapyramidal and movement disorder, unspecified: Secondary | ICD-10-CM | POA: Diagnosis not present

## 2018-06-07 DIAGNOSIS — F322 Major depressive disorder, single episode, severe without psychotic features: Secondary | ICD-10-CM | POA: Diagnosis not present

## 2018-06-07 DIAGNOSIS — Z1159 Encounter for screening for other viral diseases: Secondary | ICD-10-CM | POA: Diagnosis not present

## 2018-06-07 DIAGNOSIS — K219 Gastro-esophageal reflux disease without esophagitis: Secondary | ICD-10-CM | POA: Diagnosis not present

## 2018-06-07 DIAGNOSIS — E7800 Pure hypercholesterolemia, unspecified: Secondary | ICD-10-CM | POA: Diagnosis not present

## 2018-06-07 DIAGNOSIS — Z79899 Other long term (current) drug therapy: Secondary | ICD-10-CM | POA: Diagnosis not present

## 2018-06-07 DIAGNOSIS — F0390 Unspecified dementia without behavioral disturbance: Secondary | ICD-10-CM | POA: Diagnosis not present

## 2018-06-07 DIAGNOSIS — Z0000 Encounter for general adult medical examination without abnormal findings: Secondary | ICD-10-CM | POA: Diagnosis not present

## 2018-06-07 DIAGNOSIS — Z1389 Encounter for screening for other disorder: Secondary | ICD-10-CM | POA: Diagnosis not present

## 2018-06-07 DIAGNOSIS — I10 Essential (primary) hypertension: Secondary | ICD-10-CM | POA: Diagnosis not present

## 2018-06-07 DIAGNOSIS — G629 Polyneuropathy, unspecified: Secondary | ICD-10-CM | POA: Diagnosis not present

## 2018-06-17 DIAGNOSIS — L723 Sebaceous cyst: Secondary | ICD-10-CM | POA: Diagnosis not present

## 2018-06-22 DIAGNOSIS — F331 Major depressive disorder, recurrent, moderate: Secondary | ICD-10-CM | POA: Diagnosis not present

## 2018-06-29 ENCOUNTER — Other Ambulatory Visit: Payer: Self-pay | Admitting: Internal Medicine

## 2018-06-29 DIAGNOSIS — Z1239 Encounter for other screening for malignant neoplasm of breast: Secondary | ICD-10-CM

## 2018-07-12 DIAGNOSIS — R1310 Dysphagia, unspecified: Secondary | ICD-10-CM | POA: Diagnosis not present

## 2018-07-12 DIAGNOSIS — R270 Ataxia, unspecified: Secondary | ICD-10-CM | POA: Diagnosis not present

## 2018-07-21 DIAGNOSIS — F331 Major depressive disorder, recurrent, moderate: Secondary | ICD-10-CM | POA: Diagnosis not present

## 2018-08-04 DIAGNOSIS — R05 Cough: Secondary | ICD-10-CM | POA: Diagnosis not present

## 2018-08-04 DIAGNOSIS — R1310 Dysphagia, unspecified: Secondary | ICD-10-CM | POA: Diagnosis not present

## 2018-08-04 DIAGNOSIS — R1312 Dysphagia, oropharyngeal phase: Secondary | ICD-10-CM | POA: Diagnosis not present

## 2018-08-04 DIAGNOSIS — M47812 Spondylosis without myelopathy or radiculopathy, cervical region: Secondary | ICD-10-CM | POA: Diagnosis not present

## 2018-08-11 ENCOUNTER — Ambulatory Visit: Payer: Medicare Other

## 2018-08-11 DIAGNOSIS — Z1239 Encounter for other screening for malignant neoplasm of breast: Secondary | ICD-10-CM

## 2018-08-11 DIAGNOSIS — Z1231 Encounter for screening mammogram for malignant neoplasm of breast: Secondary | ICD-10-CM | POA: Diagnosis not present

## 2018-08-17 DIAGNOSIS — Z124 Encounter for screening for malignant neoplasm of cervix: Secondary | ICD-10-CM | POA: Diagnosis not present

## 2018-08-17 DIAGNOSIS — Z01419 Encounter for gynecological examination (general) (routine) without abnormal findings: Secondary | ICD-10-CM | POA: Diagnosis not present

## 2018-08-18 ENCOUNTER — Ambulatory Visit (HOSPITAL_COMMUNITY): Payer: Medicare Other | Admitting: Psychiatry

## 2018-08-25 DIAGNOSIS — F331 Major depressive disorder, recurrent, moderate: Secondary | ICD-10-CM | POA: Diagnosis not present

## 2018-09-15 ENCOUNTER — Ambulatory Visit: Payer: Medicare Other | Admitting: Sports Medicine

## 2018-09-15 DIAGNOSIS — F603 Borderline personality disorder: Secondary | ICD-10-CM | POA: Diagnosis not present

## 2018-09-15 DIAGNOSIS — F4489 Other dissociative and conversion disorders: Secondary | ICD-10-CM | POA: Diagnosis not present

## 2018-09-15 DIAGNOSIS — F0280 Dementia in other diseases classified elsewhere without behavioral disturbance: Secondary | ICD-10-CM | POA: Diagnosis not present

## 2018-09-15 DIAGNOSIS — F4312 Post-traumatic stress disorder, chronic: Secondary | ICD-10-CM | POA: Diagnosis not present

## 2018-09-19 DIAGNOSIS — J208 Acute bronchitis due to other specified organisms: Secondary | ICD-10-CM | POA: Diagnosis not present

## 2018-09-19 DIAGNOSIS — J329 Chronic sinusitis, unspecified: Secondary | ICD-10-CM | POA: Diagnosis not present

## 2018-09-19 DIAGNOSIS — B9689 Other specified bacterial agents as the cause of diseases classified elsewhere: Secondary | ICD-10-CM | POA: Diagnosis not present

## 2018-09-26 DIAGNOSIS — M25572 Pain in left ankle and joints of left foot: Secondary | ICD-10-CM | POA: Diagnosis not present

## 2018-09-26 DIAGNOSIS — M79641 Pain in right hand: Secondary | ICD-10-CM | POA: Diagnosis not present

## 2018-10-10 ENCOUNTER — Ambulatory Visit (HOSPITAL_COMMUNITY): Payer: Medicare Other | Admitting: Psychiatry

## 2018-11-01 DIAGNOSIS — F331 Major depressive disorder, recurrent, moderate: Secondary | ICD-10-CM | POA: Diagnosis not present

## 2018-11-22 DIAGNOSIS — F331 Major depressive disorder, recurrent, moderate: Secondary | ICD-10-CM | POA: Diagnosis not present

## 2018-11-23 DIAGNOSIS — N3281 Overactive bladder: Secondary | ICD-10-CM | POA: Diagnosis not present

## 2018-11-23 DIAGNOSIS — N3941 Urge incontinence: Secondary | ICD-10-CM | POA: Diagnosis not present

## 2018-11-24 DIAGNOSIS — F332 Major depressive disorder, recurrent severe without psychotic features: Secondary | ICD-10-CM | POA: Diagnosis not present

## 2018-11-24 DIAGNOSIS — F603 Borderline personality disorder: Secondary | ICD-10-CM | POA: Diagnosis not present

## 2018-11-24 DIAGNOSIS — F4312 Post-traumatic stress disorder, chronic: Secondary | ICD-10-CM | POA: Diagnosis not present

## 2018-11-28 DIAGNOSIS — F331 Major depressive disorder, recurrent, moderate: Secondary | ICD-10-CM | POA: Diagnosis not present

## 2018-11-29 DIAGNOSIS — Z85828 Personal history of other malignant neoplasm of skin: Secondary | ICD-10-CM | POA: Diagnosis not present

## 2018-11-29 DIAGNOSIS — L570 Actinic keratosis: Secondary | ICD-10-CM | POA: Diagnosis not present

## 2018-11-29 DIAGNOSIS — L821 Other seborrheic keratosis: Secondary | ICD-10-CM | POA: Diagnosis not present

## 2018-11-29 DIAGNOSIS — L812 Freckles: Secondary | ICD-10-CM | POA: Diagnosis not present

## 2018-12-08 DIAGNOSIS — R269 Unspecified abnormalities of gait and mobility: Secondary | ICD-10-CM | POA: Diagnosis not present

## 2018-12-08 DIAGNOSIS — E7800 Pure hypercholesterolemia, unspecified: Secondary | ICD-10-CM | POA: Diagnosis not present

## 2018-12-08 DIAGNOSIS — F322 Major depressive disorder, single episode, severe without psychotic features: Secondary | ICD-10-CM | POA: Diagnosis not present

## 2018-12-08 DIAGNOSIS — G259 Extrapyramidal and movement disorder, unspecified: Secondary | ICD-10-CM | POA: Diagnosis not present

## 2018-12-08 DIAGNOSIS — F0390 Unspecified dementia without behavioral disturbance: Secondary | ICD-10-CM | POA: Diagnosis not present

## 2018-12-08 DIAGNOSIS — M5431 Sciatica, right side: Secondary | ICD-10-CM | POA: Diagnosis not present

## 2018-12-08 DIAGNOSIS — K219 Gastro-esophageal reflux disease without esophagitis: Secondary | ICD-10-CM | POA: Diagnosis not present

## 2018-12-08 DIAGNOSIS — M810 Age-related osteoporosis without current pathological fracture: Secondary | ICD-10-CM | POA: Diagnosis not present

## 2018-12-08 DIAGNOSIS — I10 Essential (primary) hypertension: Secondary | ICD-10-CM | POA: Diagnosis not present

## 2018-12-23 DIAGNOSIS — M5431 Sciatica, right side: Secondary | ICD-10-CM | POA: Diagnosis not present

## 2018-12-23 DIAGNOSIS — M79604 Pain in right leg: Secondary | ICD-10-CM | POA: Diagnosis not present

## 2018-12-23 DIAGNOSIS — M545 Low back pain: Secondary | ICD-10-CM | POA: Diagnosis not present

## 2018-12-29 DIAGNOSIS — F331 Major depressive disorder, recurrent, moderate: Secondary | ICD-10-CM | POA: Diagnosis not present

## 2019-01-04 DIAGNOSIS — S61452A Open bite of left hand, initial encounter: Secondary | ICD-10-CM | POA: Diagnosis not present

## 2019-01-04 DIAGNOSIS — W5501XA Bitten by cat, initial encounter: Secondary | ICD-10-CM | POA: Diagnosis not present

## 2019-01-11 DIAGNOSIS — F603 Borderline personality disorder: Secondary | ICD-10-CM | POA: Diagnosis not present

## 2019-01-11 DIAGNOSIS — F332 Major depressive disorder, recurrent severe without psychotic features: Secondary | ICD-10-CM | POA: Diagnosis not present

## 2019-01-11 DIAGNOSIS — F4312 Post-traumatic stress disorder, chronic: Secondary | ICD-10-CM | POA: Diagnosis not present

## 2019-01-12 DIAGNOSIS — G255 Other chorea: Secondary | ICD-10-CM | POA: Diagnosis not present

## 2019-01-12 DIAGNOSIS — R413 Other amnesia: Secondary | ICD-10-CM | POA: Diagnosis not present

## 2019-01-16 DIAGNOSIS — M545 Low back pain: Secondary | ICD-10-CM | POA: Diagnosis not present

## 2019-01-16 DIAGNOSIS — R2689 Other abnormalities of gait and mobility: Secondary | ICD-10-CM | POA: Diagnosis not present

## 2019-01-16 DIAGNOSIS — M6281 Muscle weakness (generalized): Secondary | ICD-10-CM | POA: Diagnosis not present

## 2019-01-18 DIAGNOSIS — M545 Low back pain: Secondary | ICD-10-CM | POA: Diagnosis not present

## 2019-01-18 DIAGNOSIS — R2689 Other abnormalities of gait and mobility: Secondary | ICD-10-CM | POA: Diagnosis not present

## 2019-01-18 DIAGNOSIS — M6281 Muscle weakness (generalized): Secondary | ICD-10-CM | POA: Diagnosis not present

## 2019-01-23 DIAGNOSIS — R279 Unspecified lack of coordination: Secondary | ICD-10-CM | POA: Diagnosis not present

## 2019-01-23 DIAGNOSIS — M6281 Muscle weakness (generalized): Secondary | ICD-10-CM | POA: Diagnosis not present

## 2019-01-23 DIAGNOSIS — R2689 Other abnormalities of gait and mobility: Secondary | ICD-10-CM | POA: Diagnosis not present

## 2019-01-25 DIAGNOSIS — R2689 Other abnormalities of gait and mobility: Secondary | ICD-10-CM | POA: Diagnosis not present

## 2019-01-25 DIAGNOSIS — R279 Unspecified lack of coordination: Secondary | ICD-10-CM | POA: Diagnosis not present

## 2019-01-30 DIAGNOSIS — M545 Low back pain: Secondary | ICD-10-CM | POA: Diagnosis not present

## 2019-01-30 DIAGNOSIS — R279 Unspecified lack of coordination: Secondary | ICD-10-CM | POA: Diagnosis not present

## 2019-01-30 DIAGNOSIS — R262 Difficulty in walking, not elsewhere classified: Secondary | ICD-10-CM | POA: Diagnosis not present

## 2019-02-01 DIAGNOSIS — R262 Difficulty in walking, not elsewhere classified: Secondary | ICD-10-CM | POA: Diagnosis not present

## 2019-02-01 DIAGNOSIS — R279 Unspecified lack of coordination: Secondary | ICD-10-CM | POA: Diagnosis not present

## 2019-02-01 DIAGNOSIS — M545 Low back pain: Secondary | ICD-10-CM | POA: Diagnosis not present

## 2019-02-06 DIAGNOSIS — R2689 Other abnormalities of gait and mobility: Secondary | ICD-10-CM | POA: Diagnosis not present

## 2019-02-06 DIAGNOSIS — R279 Unspecified lack of coordination: Secondary | ICD-10-CM | POA: Diagnosis not present

## 2019-02-08 DIAGNOSIS — R279 Unspecified lack of coordination: Secondary | ICD-10-CM | POA: Diagnosis not present

## 2019-02-08 DIAGNOSIS — R2689 Other abnormalities of gait and mobility: Secondary | ICD-10-CM | POA: Diagnosis not present

## 2019-02-08 DIAGNOSIS — M545 Low back pain: Secondary | ICD-10-CM | POA: Diagnosis not present

## 2019-02-20 DIAGNOSIS — M545 Low back pain: Secondary | ICD-10-CM | POA: Diagnosis not present

## 2019-02-20 DIAGNOSIS — M6281 Muscle weakness (generalized): Secondary | ICD-10-CM | POA: Diagnosis not present

## 2019-02-20 DIAGNOSIS — F331 Major depressive disorder, recurrent, moderate: Secondary | ICD-10-CM | POA: Diagnosis not present

## 2019-02-20 DIAGNOSIS — R279 Unspecified lack of coordination: Secondary | ICD-10-CM | POA: Diagnosis not present

## 2019-02-22 DIAGNOSIS — R279 Unspecified lack of coordination: Secondary | ICD-10-CM | POA: Diagnosis not present

## 2019-02-22 DIAGNOSIS — R2689 Other abnormalities of gait and mobility: Secondary | ICD-10-CM | POA: Diagnosis not present

## 2019-02-23 DIAGNOSIS — F0390 Unspecified dementia without behavioral disturbance: Secondary | ICD-10-CM | POA: Diagnosis not present

## 2019-02-23 DIAGNOSIS — M5431 Sciatica, right side: Secondary | ICD-10-CM | POA: Diagnosis not present

## 2019-02-23 DIAGNOSIS — M545 Low back pain: Secondary | ICD-10-CM | POA: Diagnosis not present

## 2019-02-27 DIAGNOSIS — R2689 Other abnormalities of gait and mobility: Secondary | ICD-10-CM | POA: Diagnosis not present

## 2019-02-27 DIAGNOSIS — R279 Unspecified lack of coordination: Secondary | ICD-10-CM | POA: Diagnosis not present

## 2019-03-06 DIAGNOSIS — R2689 Other abnormalities of gait and mobility: Secondary | ICD-10-CM | POA: Diagnosis not present

## 2019-03-06 DIAGNOSIS — R279 Unspecified lack of coordination: Secondary | ICD-10-CM | POA: Diagnosis not present

## 2019-03-13 DIAGNOSIS — M6281 Muscle weakness (generalized): Secondary | ICD-10-CM | POA: Diagnosis not present

## 2019-03-13 DIAGNOSIS — R2689 Other abnormalities of gait and mobility: Secondary | ICD-10-CM | POA: Diagnosis not present

## 2019-03-14 DIAGNOSIS — F331 Major depressive disorder, recurrent, moderate: Secondary | ICD-10-CM | POA: Diagnosis not present

## 2019-03-16 DIAGNOSIS — F331 Major depressive disorder, recurrent, moderate: Secondary | ICD-10-CM | POA: Diagnosis not present

## 2019-03-20 DIAGNOSIS — R2689 Other abnormalities of gait and mobility: Secondary | ICD-10-CM | POA: Diagnosis not present

## 2019-03-20 DIAGNOSIS — M6281 Muscle weakness (generalized): Secondary | ICD-10-CM | POA: Diagnosis not present

## 2019-03-20 DIAGNOSIS — R279 Unspecified lack of coordination: Secondary | ICD-10-CM | POA: Diagnosis not present

## 2019-03-28 DIAGNOSIS — F331 Major depressive disorder, recurrent, moderate: Secondary | ICD-10-CM | POA: Diagnosis not present

## 2019-03-29 DIAGNOSIS — R2689 Other abnormalities of gait and mobility: Secondary | ICD-10-CM | POA: Diagnosis not present

## 2019-03-29 DIAGNOSIS — R279 Unspecified lack of coordination: Secondary | ICD-10-CM | POA: Diagnosis not present

## 2019-04-03 DIAGNOSIS — R279 Unspecified lack of coordination: Secondary | ICD-10-CM | POA: Diagnosis not present

## 2019-04-03 DIAGNOSIS — R2689 Other abnormalities of gait and mobility: Secondary | ICD-10-CM | POA: Diagnosis not present

## 2019-04-10 DIAGNOSIS — R279 Unspecified lack of coordination: Secondary | ICD-10-CM | POA: Diagnosis not present

## 2019-04-10 DIAGNOSIS — R2689 Other abnormalities of gait and mobility: Secondary | ICD-10-CM | POA: Diagnosis not present

## 2019-04-13 DIAGNOSIS — S83241A Other tear of medial meniscus, current injury, right knee, initial encounter: Secondary | ICD-10-CM | POA: Diagnosis not present

## 2019-04-14 DIAGNOSIS — F603 Borderline personality disorder: Secondary | ICD-10-CM | POA: Diagnosis not present

## 2019-04-14 DIAGNOSIS — F4312 Post-traumatic stress disorder, chronic: Secondary | ICD-10-CM | POA: Diagnosis not present

## 2019-04-14 DIAGNOSIS — F332 Major depressive disorder, recurrent severe without psychotic features: Secondary | ICD-10-CM | POA: Diagnosis not present

## 2019-04-19 DIAGNOSIS — R2689 Other abnormalities of gait and mobility: Secondary | ICD-10-CM | POA: Diagnosis not present

## 2019-04-19 DIAGNOSIS — R279 Unspecified lack of coordination: Secondary | ICD-10-CM | POA: Diagnosis not present

## 2019-04-19 DIAGNOSIS — M6281 Muscle weakness (generalized): Secondary | ICD-10-CM | POA: Diagnosis not present

## 2019-04-24 DIAGNOSIS — M6281 Muscle weakness (generalized): Secondary | ICD-10-CM | POA: Diagnosis not present

## 2019-04-24 DIAGNOSIS — M25561 Pain in right knee: Secondary | ICD-10-CM | POA: Diagnosis not present

## 2019-04-24 DIAGNOSIS — F331 Major depressive disorder, recurrent, moderate: Secondary | ICD-10-CM | POA: Diagnosis not present

## 2019-04-24 DIAGNOSIS — R2689 Other abnormalities of gait and mobility: Secondary | ICD-10-CM | POA: Diagnosis not present

## 2019-04-27 DIAGNOSIS — M6281 Muscle weakness (generalized): Secondary | ICD-10-CM | POA: Diagnosis not present

## 2019-04-27 DIAGNOSIS — M25561 Pain in right knee: Secondary | ICD-10-CM | POA: Diagnosis not present

## 2019-04-27 DIAGNOSIS — R262 Difficulty in walking, not elsewhere classified: Secondary | ICD-10-CM | POA: Diagnosis not present

## 2019-05-01 DIAGNOSIS — M6281 Muscle weakness (generalized): Secondary | ICD-10-CM | POA: Diagnosis not present

## 2019-05-01 DIAGNOSIS — M25561 Pain in right knee: Secondary | ICD-10-CM | POA: Diagnosis not present

## 2019-05-03 DIAGNOSIS — R2689 Other abnormalities of gait and mobility: Secondary | ICD-10-CM | POA: Diagnosis not present

## 2019-05-03 DIAGNOSIS — M25561 Pain in right knee: Secondary | ICD-10-CM | POA: Diagnosis not present

## 2019-05-08 DIAGNOSIS — F331 Major depressive disorder, recurrent, moderate: Secondary | ICD-10-CM | POA: Diagnosis not present

## 2019-05-08 DIAGNOSIS — M6281 Muscle weakness (generalized): Secondary | ICD-10-CM | POA: Diagnosis not present

## 2019-05-08 DIAGNOSIS — M25561 Pain in right knee: Secondary | ICD-10-CM | POA: Diagnosis not present

## 2019-05-10 DIAGNOSIS — M6281 Muscle weakness (generalized): Secondary | ICD-10-CM | POA: Diagnosis not present

## 2019-05-10 DIAGNOSIS — M25561 Pain in right knee: Secondary | ICD-10-CM | POA: Diagnosis not present

## 2019-05-16 DIAGNOSIS — R262 Difficulty in walking, not elsewhere classified: Secondary | ICD-10-CM | POA: Diagnosis not present

## 2019-05-16 DIAGNOSIS — M25561 Pain in right knee: Secondary | ICD-10-CM | POA: Diagnosis not present

## 2019-05-16 DIAGNOSIS — M6281 Muscle weakness (generalized): Secondary | ICD-10-CM | POA: Diagnosis not present

## 2019-05-18 DIAGNOSIS — M25561 Pain in right knee: Secondary | ICD-10-CM | POA: Diagnosis not present

## 2019-05-18 DIAGNOSIS — M6281 Muscle weakness (generalized): Secondary | ICD-10-CM | POA: Diagnosis not present

## 2019-05-22 DIAGNOSIS — R262 Difficulty in walking, not elsewhere classified: Secondary | ICD-10-CM | POA: Diagnosis not present

## 2019-05-22 DIAGNOSIS — M6281 Muscle weakness (generalized): Secondary | ICD-10-CM | POA: Diagnosis not present

## 2019-05-22 DIAGNOSIS — M25561 Pain in right knee: Secondary | ICD-10-CM | POA: Diagnosis not present

## 2019-05-24 DIAGNOSIS — M6281 Muscle weakness (generalized): Secondary | ICD-10-CM | POA: Diagnosis not present

## 2019-05-24 DIAGNOSIS — R2689 Other abnormalities of gait and mobility: Secondary | ICD-10-CM | POA: Diagnosis not present

## 2019-05-24 DIAGNOSIS — M25561 Pain in right knee: Secondary | ICD-10-CM | POA: Diagnosis not present

## 2019-05-30 DIAGNOSIS — R262 Difficulty in walking, not elsewhere classified: Secondary | ICD-10-CM | POA: Diagnosis not present

## 2019-05-30 DIAGNOSIS — M25561 Pain in right knee: Secondary | ICD-10-CM | POA: Diagnosis not present

## 2019-05-30 DIAGNOSIS — M6281 Muscle weakness (generalized): Secondary | ICD-10-CM | POA: Diagnosis not present

## 2019-06-01 DIAGNOSIS — M25561 Pain in right knee: Secondary | ICD-10-CM | POA: Diagnosis not present

## 2019-06-01 DIAGNOSIS — M6281 Muscle weakness (generalized): Secondary | ICD-10-CM | POA: Diagnosis not present

## 2019-06-01 DIAGNOSIS — R262 Difficulty in walking, not elsewhere classified: Secondary | ICD-10-CM | POA: Diagnosis not present

## 2019-06-05 DIAGNOSIS — F331 Major depressive disorder, recurrent, moderate: Secondary | ICD-10-CM | POA: Diagnosis not present

## 2019-06-06 DIAGNOSIS — Z85828 Personal history of other malignant neoplasm of skin: Secondary | ICD-10-CM | POA: Diagnosis not present

## 2019-06-06 DIAGNOSIS — L570 Actinic keratosis: Secondary | ICD-10-CM | POA: Diagnosis not present

## 2019-06-06 DIAGNOSIS — L812 Freckles: Secondary | ICD-10-CM | POA: Diagnosis not present

## 2019-06-06 DIAGNOSIS — D225 Melanocytic nevi of trunk: Secondary | ICD-10-CM | POA: Diagnosis not present

## 2019-06-06 DIAGNOSIS — M6281 Muscle weakness (generalized): Secondary | ICD-10-CM | POA: Diagnosis not present

## 2019-06-06 DIAGNOSIS — M25561 Pain in right knee: Secondary | ICD-10-CM | POA: Diagnosis not present

## 2019-06-06 DIAGNOSIS — L821 Other seborrheic keratosis: Secondary | ICD-10-CM | POA: Diagnosis not present

## 2019-06-12 DIAGNOSIS — S83241D Other tear of medial meniscus, current injury, right knee, subsequent encounter: Secondary | ICD-10-CM | POA: Diagnosis not present

## 2019-06-13 DIAGNOSIS — M25561 Pain in right knee: Secondary | ICD-10-CM | POA: Diagnosis not present

## 2019-06-13 DIAGNOSIS — M6281 Muscle weakness (generalized): Secondary | ICD-10-CM | POA: Diagnosis not present

## 2019-06-14 DIAGNOSIS — E7800 Pure hypercholesterolemia, unspecified: Secondary | ICD-10-CM | POA: Diagnosis not present

## 2019-06-14 DIAGNOSIS — Z0000 Encounter for general adult medical examination without abnormal findings: Secondary | ICD-10-CM | POA: Diagnosis not present

## 2019-06-14 DIAGNOSIS — F0390 Unspecified dementia without behavioral disturbance: Secondary | ICD-10-CM | POA: Diagnosis not present

## 2019-06-14 DIAGNOSIS — I10 Essential (primary) hypertension: Secondary | ICD-10-CM | POA: Diagnosis not present

## 2019-06-14 DIAGNOSIS — K5900 Constipation, unspecified: Secondary | ICD-10-CM | POA: Diagnosis not present

## 2019-06-14 DIAGNOSIS — R269 Unspecified abnormalities of gait and mobility: Secondary | ICD-10-CM | POA: Diagnosis not present

## 2019-06-14 DIAGNOSIS — F324 Major depressive disorder, single episode, in partial remission: Secondary | ICD-10-CM | POA: Diagnosis not present

## 2019-06-14 DIAGNOSIS — G259 Extrapyramidal and movement disorder, unspecified: Secondary | ICD-10-CM | POA: Diagnosis not present

## 2019-06-14 DIAGNOSIS — G629 Polyneuropathy, unspecified: Secondary | ICD-10-CM | POA: Diagnosis not present

## 2019-06-14 DIAGNOSIS — M797 Fibromyalgia: Secondary | ICD-10-CM | POA: Diagnosis not present

## 2019-06-14 DIAGNOSIS — Z1389 Encounter for screening for other disorder: Secondary | ICD-10-CM | POA: Diagnosis not present

## 2019-06-15 DIAGNOSIS — E7800 Pure hypercholesterolemia, unspecified: Secondary | ICD-10-CM | POA: Diagnosis not present

## 2019-06-15 DIAGNOSIS — I10 Essential (primary) hypertension: Secondary | ICD-10-CM | POA: Diagnosis not present

## 2019-06-16 DIAGNOSIS — N760 Acute vaginitis: Secondary | ICD-10-CM | POA: Diagnosis not present

## 2019-06-20 DIAGNOSIS — M25561 Pain in right knee: Secondary | ICD-10-CM | POA: Diagnosis not present

## 2019-06-20 DIAGNOSIS — M6281 Muscle weakness (generalized): Secondary | ICD-10-CM | POA: Diagnosis not present

## 2019-06-21 DIAGNOSIS — M25561 Pain in right knee: Secondary | ICD-10-CM | POA: Diagnosis not present

## 2019-06-21 DIAGNOSIS — M6281 Muscle weakness (generalized): Secondary | ICD-10-CM | POA: Diagnosis not present

## 2019-06-27 DIAGNOSIS — R2689 Other abnormalities of gait and mobility: Secondary | ICD-10-CM | POA: Diagnosis not present

## 2019-06-27 DIAGNOSIS — R262 Difficulty in walking, not elsewhere classified: Secondary | ICD-10-CM | POA: Diagnosis not present

## 2019-06-27 DIAGNOSIS — M25561 Pain in right knee: Secondary | ICD-10-CM | POA: Diagnosis not present

## 2019-06-29 DIAGNOSIS — Z85828 Personal history of other malignant neoplasm of skin: Secondary | ICD-10-CM | POA: Diagnosis not present

## 2019-06-29 DIAGNOSIS — L239 Allergic contact dermatitis, unspecified cause: Secondary | ICD-10-CM | POA: Diagnosis not present

## 2019-07-03 DIAGNOSIS — F331 Major depressive disorder, recurrent, moderate: Secondary | ICD-10-CM | POA: Diagnosis not present

## 2019-07-04 DIAGNOSIS — F4312 Post-traumatic stress disorder, chronic: Secondary | ICD-10-CM | POA: Diagnosis not present

## 2019-07-04 DIAGNOSIS — M6281 Muscle weakness (generalized): Secondary | ICD-10-CM | POA: Diagnosis not present

## 2019-07-04 DIAGNOSIS — M25561 Pain in right knee: Secondary | ICD-10-CM | POA: Diagnosis not present

## 2019-07-04 DIAGNOSIS — F603 Borderline personality disorder: Secondary | ICD-10-CM | POA: Diagnosis not present

## 2019-07-04 DIAGNOSIS — F332 Major depressive disorder, recurrent severe without psychotic features: Secondary | ICD-10-CM | POA: Diagnosis not present

## 2019-07-06 DIAGNOSIS — R279 Unspecified lack of coordination: Secondary | ICD-10-CM | POA: Diagnosis not present

## 2019-07-06 DIAGNOSIS — M6281 Muscle weakness (generalized): Secondary | ICD-10-CM | POA: Diagnosis not present

## 2019-07-06 DIAGNOSIS — M25561 Pain in right knee: Secondary | ICD-10-CM | POA: Diagnosis not present

## 2019-07-13 DIAGNOSIS — G249 Dystonia, unspecified: Secondary | ICD-10-CM | POA: Diagnosis not present

## 2019-07-13 DIAGNOSIS — E786 Lipoprotein deficiency: Secondary | ICD-10-CM | POA: Diagnosis not present

## 2019-07-13 DIAGNOSIS — R269 Unspecified abnormalities of gait and mobility: Secondary | ICD-10-CM | POA: Diagnosis not present

## 2019-07-13 DIAGNOSIS — R413 Other amnesia: Secondary | ICD-10-CM | POA: Diagnosis not present

## 2019-07-13 DIAGNOSIS — G255 Other chorea: Secondary | ICD-10-CM | POA: Diagnosis not present

## 2019-07-19 ENCOUNTER — Other Ambulatory Visit: Payer: Self-pay | Admitting: Obstetrics and Gynecology

## 2019-07-19 DIAGNOSIS — Z1231 Encounter for screening mammogram for malignant neoplasm of breast: Secondary | ICD-10-CM

## 2019-07-28 DIAGNOSIS — F331 Major depressive disorder, recurrent, moderate: Secondary | ICD-10-CM | POA: Diagnosis not present

## 2019-08-01 ENCOUNTER — Ambulatory Visit (INDEPENDENT_AMBULATORY_CARE_PROVIDER_SITE_OTHER): Payer: Medicare Other | Admitting: Sports Medicine

## 2019-08-01 ENCOUNTER — Other Ambulatory Visit: Payer: Self-pay

## 2019-08-01 VITALS — BP 108/86 | Ht 61.0 in | Wt 134.0 lb

## 2019-08-01 DIAGNOSIS — M25561 Pain in right knee: Secondary | ICD-10-CM | POA: Diagnosis not present

## 2019-08-02 ENCOUNTER — Encounter: Payer: Self-pay | Admitting: Sports Medicine

## 2019-08-02 NOTE — Progress Notes (Addendum)
   Subjective:    Patient ID: Lisa Jackson, female    DOB: [birth year], 63 y.o.   MRN: [id number]  HPI chief complaint: Right knee pain  Lisa Jackson comes in today complaining of several weeks of right knee pain.  In the past she has been an avid runner but she has recently done more recreational walking.  She began to experience medial sided right knee pain several weeks ago.  She has seen Dr. Noemi Chapel and had a cortisone injection which was not very beneficial.  She had an x-ray and an MRI done at his office but those results are not available for my review.  She has been told that she has arthritis in this knee.  She does endorse some mild instability in the knee.  No swelling.  She is still wearing her custom orthotics.  They were made a couple of years ago but are still in really good shape.  She is here today with her husband.  Interim medical history reviewed Medications reviewed Allergies reviewed  Review of Systems As above    Objective:   Physical Exam  Well-developed, well-nourished.  No acute distress.  Awake alert and oriented x3.  Vital signs reviewed  Right knee: Full range of motion.  No effusion.  She is tender to palpation along the medial joint line with pain but no popping with McMurray's.  No tenderness along the lateral joint line.  Knee is grossly stable to ligamentous exam.  Mild quad weakness when compared to the uninvolved left knee.  Good pulses distally.      Assessment & Plan:   Right knee pain likely secondary to osteoarthritis  I would like to get the records from Dr. Archie Endo office, including the recent MRI report.  I recommended that we try a body helix compression sleeve and that she transition from recreational walking to an exercise bike until her pain improves.  I have given her an isometric quad exercise to start doing daily and she will follow-up with me again in 4 weeks.  We will delineate further work-up and treatment based on her response to today's  treatment and my review of Dr. Archie Endo office notes.  Her custom orthotics are still in great shape so I do not think she needs a new pair today.

## 2019-08-10 DIAGNOSIS — K219 Gastro-esophageal reflux disease without esophagitis: Secondary | ICD-10-CM | POA: Diagnosis not present

## 2019-08-10 DIAGNOSIS — R05 Cough: Secondary | ICD-10-CM | POA: Diagnosis not present

## 2019-08-11 DIAGNOSIS — N7689 Other specified inflammation of vagina and vulva: Secondary | ICD-10-CM | POA: Diagnosis not present

## 2019-08-21 ENCOUNTER — Encounter: Payer: Self-pay | Admitting: Sports Medicine

## 2019-08-22 DIAGNOSIS — F331 Major depressive disorder, recurrent, moderate: Secondary | ICD-10-CM | POA: Diagnosis not present

## 2019-08-29 ENCOUNTER — Ambulatory Visit (INDEPENDENT_AMBULATORY_CARE_PROVIDER_SITE_OTHER): Payer: Medicare Other | Admitting: Sports Medicine

## 2019-08-29 ENCOUNTER — Other Ambulatory Visit: Payer: Self-pay

## 2019-08-29 VITALS — BP 108/82 | Ht 61.0 in | Wt 133.0 lb

## 2019-08-29 DIAGNOSIS — M1711 Unilateral primary osteoarthritis, right knee: Secondary | ICD-10-CM | POA: Diagnosis not present

## 2019-08-29 NOTE — Progress Notes (Signed)
Subjective:    Patient ID: Lisa Jackson, female    DOB: 1956/08/30, 63 y.o.   MRN: [id number]  CC: (R) knee pain, follow-up  HPI  Patient is a 63 year-old female who presents for follow-up of (R) knee pain.  Patient states she has been doing well since her last appointment, she denies any current pain in the knee.  Patient has been refraining from walking/jogging or other forms of vigorous exercise since the last visit. She has been riding a stationary bike for 30 to 40 minutes 3-4 times a week without any pain.  Patient states she has been wearing a body helix knee sleeve when she is active or with prolonged standing.  She also reports performing her isometric quad strengthening exercises, 30 reps 2-3x daily, with a 1 pound ankle weight.  She is able to perform this without pain and feels her strength is improving.  Patient states she has been doing well other than a fall about a week ago, in which she was walking up a hill and felt her knee give out.  Patient states that she has dementia and balance issues, she is unsure whether it was this or her knee pain that caused the fall. She denies any other instances like this since last visit.  Her pain today is a 0/10.  She is anxious to get back to physical activity.   Review of Systems  See HPI, otherwise negative.     Objective:   Physical Exam  Gen - alert, in NAD Resp - breathing unlabored, in no resp distress Psych - appropriate mood, affect  MSK:  Knee (R) - Inspection: no gross deformity noted, very mild swelling over medial joint line; no erythema, bruising - Palpation: + TTP over medial joint line; no warmth, no crepitus; + small ~1cm nodule noted in popliteal fossa - ROM: full active and passive ROM in knee flexion/extension; + pain with passive hyperflexion of knee   - Strength/Stability: no joint instability noted or ligamentous laxity; 5/5 muscle strength - Sensory/Vasc: sensation intact to light palpation; NVI -  Provocative testing: + McMurray's test with pain with ext rotation, valgus stress but without popping/clicking; Negative Apley compression/distraction, patellar grind test; Negative anterior/posterior drawer, Lachman's test; Thessaly's test deferred  Knee (L) - no gross deformity noted, no swelling, erythema, brusing - no TTP, no crepitus - Full active & passive ROM - no joint instability, 5/5 muscle strength of LLE - sensation intact to light touch, NVI      Assessment & Plan:  1. (R) knee pain - likely 2/2 to osteoarthritis and associated cartilage loss MRI of R-knee from 06/13/2019 revealed partial-thickness cartilage loss at patellofemoral compartment and medial femorotibial compartment. Small Baker's cyst noted on MRI as well as on physical exam today in popliteal fossa, likely from partial cartilage/OA damage. Had x-ray imaging in past at Ortho clinic, but nothing recent to grade OA. Pain improved since last visit with rest, isometric quad strengthening. - gradual return to exercise, let pain be your guide  - return to walking 1/2 of your usual weekly regimen; may increase by 10% each week if no/minimal pain - continue isometric quad strengthening exercises, 3 sets of 20 reps daily with 2lb ankle weight - continue body helix knee sleeve with activity or prolonged standing; may use tylenol PRN for pain - suspect osteoarthritis is not severe based on lesser severity of symptoms and physical exam, I do think patient will do well with quad strengthening and gradual return  to exercise. Do not feel patient needs surgical intervention at this time.  - follow-up as needed or if pain returns    Renne Musca, DO PGY-2 08/29/2019  Patient seen and evaluated with the resident.  I agree with the above plan of care.  Recent MRI showed partial-thickness cartilage loss at the patellofemoral and medial femorotibial compartment.  Treatment as above.  I did mention the possibility of Visco supplementation  if symptoms do not continue to improve.  Follow-up as needed.

## 2019-08-29 NOTE — Patient Instructions (Signed)
You have osteoarthritis of your (R) knee.  Graded return to exercise - return to walking 1/2 of your usual weekly regimen (20 miles) - let pain be your guide--if feel pain during/after walking, reduce your walking distance by half - goal of increasing walking distance by 10% each week if no/minimal pain - walk on flat surfaces  Continue use of helix knee brace with prolonged standing or exercise  Continue isometric quad strengthening exercises: 3 sets of 20 daily - you may increase to 2lb weight, perform 20 reps x 3 if no/minimal pain  May take Tylenol, Ice, rest as needed

## 2019-08-30 ENCOUNTER — Ambulatory Visit: Payer: Medicare Other

## 2019-09-11 DIAGNOSIS — F331 Major depressive disorder, recurrent, moderate: Secondary | ICD-10-CM | POA: Diagnosis not present

## 2019-09-14 ENCOUNTER — Other Ambulatory Visit: Payer: Self-pay

## 2019-09-14 ENCOUNTER — Ambulatory Visit (INDEPENDENT_AMBULATORY_CARE_PROVIDER_SITE_OTHER): Payer: Medicare Other

## 2019-09-14 DIAGNOSIS — Z1231 Encounter for screening mammogram for malignant neoplasm of breast: Secondary | ICD-10-CM

## 2019-09-15 ENCOUNTER — Other Ambulatory Visit: Payer: Self-pay | Admitting: Obstetrics and Gynecology

## 2019-09-15 DIAGNOSIS — F332 Major depressive disorder, recurrent severe without psychotic features: Secondary | ICD-10-CM | POA: Diagnosis not present

## 2019-09-15 DIAGNOSIS — R928 Other abnormal and inconclusive findings on diagnostic imaging of breast: Secondary | ICD-10-CM

## 2019-09-15 DIAGNOSIS — F603 Borderline personality disorder: Secondary | ICD-10-CM | POA: Diagnosis not present

## 2019-09-15 DIAGNOSIS — F4312 Post-traumatic stress disorder, chronic: Secondary | ICD-10-CM | POA: Diagnosis not present

## 2019-09-27 ENCOUNTER — Other Ambulatory Visit: Payer: Self-pay

## 2019-09-27 ENCOUNTER — Ambulatory Visit
Admission: RE | Admit: 2019-09-27 | Discharge: 2019-09-27 | Disposition: A | Discharge status: Home / Self Care | Payer: Medicare Other | Source: Ambulatory Visit | Attending: Obstetrics and Gynecology | Admitting: Obstetrics and Gynecology

## 2019-09-27 ENCOUNTER — Ambulatory Visit: Payer: Medicare Other

## 2019-09-27 DIAGNOSIS — R928 Other abnormal and inconclusive findings on diagnostic imaging of breast: Secondary | ICD-10-CM

## 2019-10-02 DIAGNOSIS — F331 Major depressive disorder, recurrent, moderate: Secondary | ICD-10-CM | POA: Diagnosis not present

## 2019-10-30 DIAGNOSIS — F331 Major depressive disorder, recurrent, moderate: Secondary | ICD-10-CM | POA: Diagnosis not present

## 2019-11-09 DIAGNOSIS — F4312 Post-traumatic stress disorder, chronic: Secondary | ICD-10-CM | POA: Diagnosis not present

## 2019-11-09 DIAGNOSIS — F332 Major depressive disorder, recurrent severe without psychotic features: Secondary | ICD-10-CM | POA: Diagnosis not present

## 2019-11-09 DIAGNOSIS — F603 Borderline personality disorder: Secondary | ICD-10-CM | POA: Diagnosis not present

## 2019-11-14 DIAGNOSIS — K5900 Constipation, unspecified: Secondary | ICD-10-CM | POA: Diagnosis not present

## 2019-11-14 DIAGNOSIS — M810 Age-related osteoporosis without current pathological fracture: Secondary | ICD-10-CM | POA: Diagnosis not present

## 2019-11-14 DIAGNOSIS — I10 Essential (primary) hypertension: Secondary | ICD-10-CM | POA: Diagnosis not present

## 2019-11-14 DIAGNOSIS — F324 Major depressive disorder, single episode, in partial remission: Secondary | ICD-10-CM | POA: Diagnosis not present

## 2019-11-14 DIAGNOSIS — E7800 Pure hypercholesterolemia, unspecified: Secondary | ICD-10-CM | POA: Diagnosis not present

## 2019-11-14 DIAGNOSIS — F5104 Psychophysiologic insomnia: Secondary | ICD-10-CM | POA: Diagnosis not present

## 2019-11-14 DIAGNOSIS — K219 Gastro-esophageal reflux disease without esophagitis: Secondary | ICD-10-CM | POA: Diagnosis not present

## 2019-11-14 DIAGNOSIS — F0390 Unspecified dementia without behavioral disturbance: Secondary | ICD-10-CM | POA: Diagnosis not present

## 2019-11-14 DIAGNOSIS — G2581 Restless legs syndrome: Secondary | ICD-10-CM | POA: Diagnosis not present

## 2019-11-14 DIAGNOSIS — M797 Fibromyalgia: Secondary | ICD-10-CM | POA: Diagnosis not present

## 2019-11-14 DIAGNOSIS — G259 Extrapyramidal and movement disorder, unspecified: Secondary | ICD-10-CM | POA: Diagnosis not present

## 2019-11-14 DIAGNOSIS — J309 Allergic rhinitis, unspecified: Secondary | ICD-10-CM | POA: Diagnosis not present

## 2019-11-27 DIAGNOSIS — R52 Pain, unspecified: Secondary | ICD-10-CM | POA: Diagnosis not present

## 2019-11-27 DIAGNOSIS — Z20828 Contact with and (suspected) exposure to other viral communicable diseases: Secondary | ICD-10-CM | POA: Diagnosis not present

## 2019-11-27 DIAGNOSIS — R519 Headache, unspecified: Secondary | ICD-10-CM | POA: Diagnosis not present

## 2019-11-27 DIAGNOSIS — J029 Acute pharyngitis, unspecified: Secondary | ICD-10-CM | POA: Diagnosis not present

## 2019-12-01 DIAGNOSIS — N3281 Overactive bladder: Secondary | ICD-10-CM | POA: Diagnosis not present

## 2019-12-01 DIAGNOSIS — N3941 Urge incontinence: Secondary | ICD-10-CM | POA: Diagnosis not present

## 2019-12-06 DIAGNOSIS — Z85828 Personal history of other malignant neoplasm of skin: Secondary | ICD-10-CM | POA: Diagnosis not present

## 2019-12-06 DIAGNOSIS — L812 Freckles: Secondary | ICD-10-CM | POA: Diagnosis not present

## 2019-12-06 DIAGNOSIS — I788 Other diseases of capillaries: Secondary | ICD-10-CM | POA: Diagnosis not present

## 2019-12-06 DIAGNOSIS — L720 Epidermal cyst: Secondary | ICD-10-CM | POA: Diagnosis not present

## 2019-12-06 DIAGNOSIS — L570 Actinic keratosis: Secondary | ICD-10-CM | POA: Diagnosis not present

## 2019-12-06 DIAGNOSIS — L821 Other seborrheic keratosis: Secondary | ICD-10-CM | POA: Diagnosis not present

## 2019-12-26 DIAGNOSIS — F331 Major depressive disorder, recurrent, moderate: Secondary | ICD-10-CM | POA: Diagnosis not present

## 2020-01-09 DIAGNOSIS — F331 Major depressive disorder, recurrent, moderate: Secondary | ICD-10-CM | POA: Diagnosis not present

## 2020-01-17 DIAGNOSIS — G255 Other chorea: Secondary | ICD-10-CM | POA: Diagnosis not present

## 2020-01-17 DIAGNOSIS — R413 Other amnesia: Secondary | ICD-10-CM | POA: Diagnosis not present

## 2020-01-23 DIAGNOSIS — F331 Major depressive disorder, recurrent, moderate: Secondary | ICD-10-CM | POA: Diagnosis not present

## 2020-01-29 DIAGNOSIS — F4312 Post-traumatic stress disorder, chronic: Secondary | ICD-10-CM | POA: Diagnosis not present

## 2020-01-29 DIAGNOSIS — F603 Borderline personality disorder: Secondary | ICD-10-CM | POA: Diagnosis not present

## 2020-01-29 DIAGNOSIS — F332 Major depressive disorder, recurrent severe without psychotic features: Secondary | ICD-10-CM | POA: Diagnosis not present

## 2020-02-12 DIAGNOSIS — F331 Major depressive disorder, recurrent, moderate: Secondary | ICD-10-CM | POA: Diagnosis not present

## 2020-02-20 DIAGNOSIS — R110 Nausea: Secondary | ICD-10-CM | POA: Diagnosis not present

## 2020-02-20 DIAGNOSIS — Z1152 Encounter for screening for COVID-19: Secondary | ICD-10-CM | POA: Diagnosis not present

## 2020-02-20 DIAGNOSIS — R197 Diarrhea, unspecified: Secondary | ICD-10-CM | POA: Diagnosis not present

## 2020-02-20 DIAGNOSIS — R05 Cough: Secondary | ICD-10-CM | POA: Diagnosis not present

## 2020-02-27 DIAGNOSIS — R630 Anorexia: Secondary | ICD-10-CM | POA: Diagnosis not present

## 2020-02-27 DIAGNOSIS — F331 Major depressive disorder, recurrent, moderate: Secondary | ICD-10-CM | POA: Diagnosis not present

## 2020-02-27 DIAGNOSIS — R110 Nausea: Secondary | ICD-10-CM | POA: Diagnosis not present

## 2020-02-29 DIAGNOSIS — R4189 Other symptoms and signs involving cognitive functions and awareness: Secondary | ICD-10-CM | POA: Diagnosis not present

## 2020-02-29 DIAGNOSIS — R413 Other amnesia: Secondary | ICD-10-CM | POA: Diagnosis not present

## 2020-03-19 DIAGNOSIS — F331 Major depressive disorder, recurrent, moderate: Secondary | ICD-10-CM | POA: Diagnosis not present

## 2020-03-28 DIAGNOSIS — R413 Other amnesia: Secondary | ICD-10-CM | POA: Diagnosis not present

## 2020-03-28 DIAGNOSIS — R4189 Other symptoms and signs involving cognitive functions and awareness: Secondary | ICD-10-CM | POA: Diagnosis not present

## 2020-03-28 DIAGNOSIS — R269 Unspecified abnormalities of gait and mobility: Secondary | ICD-10-CM | POA: Diagnosis not present

## 2020-03-28 DIAGNOSIS — G255 Other chorea: Secondary | ICD-10-CM | POA: Diagnosis not present

## 2020-04-01 DIAGNOSIS — F603 Borderline personality disorder: Secondary | ICD-10-CM | POA: Diagnosis not present

## 2020-04-01 DIAGNOSIS — F4312 Post-traumatic stress disorder, chronic: Secondary | ICD-10-CM | POA: Diagnosis not present

## 2020-04-01 DIAGNOSIS — F332 Major depressive disorder, recurrent severe without psychotic features: Secondary | ICD-10-CM | POA: Diagnosis not present

## 2020-04-05 DIAGNOSIS — I6782 Cerebral ischemia: Secondary | ICD-10-CM | POA: Diagnosis not present

## 2020-04-05 DIAGNOSIS — G255 Other chorea: Secondary | ICD-10-CM | POA: Diagnosis not present

## 2020-04-05 DIAGNOSIS — R413 Other amnesia: Secondary | ICD-10-CM | POA: Diagnosis not present

## 2020-04-05 DIAGNOSIS — R4189 Other symptoms and signs involving cognitive functions and awareness: Secondary | ICD-10-CM | POA: Diagnosis not present

## 2020-04-05 DIAGNOSIS — R269 Unspecified abnormalities of gait and mobility: Secondary | ICD-10-CM | POA: Diagnosis not present

## 2020-04-09 DIAGNOSIS — F331 Major depressive disorder, recurrent, moderate: Secondary | ICD-10-CM | POA: Diagnosis not present

## 2020-04-19 DIAGNOSIS — R05 Cough: Secondary | ICD-10-CM | POA: Diagnosis not present

## 2020-05-01 ENCOUNTER — Other Ambulatory Visit: Payer: Self-pay | Admitting: Internal Medicine

## 2020-05-01 ENCOUNTER — Ambulatory Visit
Admission: RE | Admit: 2020-05-01 | Discharge: 2020-05-01 | Disposition: A | Discharge status: Home / Self Care | Payer: Medicare Other | Source: Ambulatory Visit | Attending: Internal Medicine | Admitting: Internal Medicine

## 2020-05-01 DIAGNOSIS — R059 Cough, unspecified: Secondary | ICD-10-CM

## 2020-05-01 DIAGNOSIS — R05 Cough: Secondary | ICD-10-CM | POA: Diagnosis not present

## 2020-05-02 DIAGNOSIS — R4189 Other symptoms and signs involving cognitive functions and awareness: Secondary | ICD-10-CM | POA: Diagnosis not present

## 2020-05-02 DIAGNOSIS — G255 Other chorea: Secondary | ICD-10-CM | POA: Diagnosis not present

## 2020-05-02 DIAGNOSIS — R413 Other amnesia: Secondary | ICD-10-CM | POA: Diagnosis not present

## 2020-05-02 DIAGNOSIS — R269 Unspecified abnormalities of gait and mobility: Secondary | ICD-10-CM | POA: Diagnosis not present

## 2020-05-06 DIAGNOSIS — F4312 Post-traumatic stress disorder, chronic: Secondary | ICD-10-CM | POA: Diagnosis not present

## 2020-05-06 DIAGNOSIS — F332 Major depressive disorder, recurrent severe without psychotic features: Secondary | ICD-10-CM | POA: Diagnosis not present

## 2020-05-06 DIAGNOSIS — F603 Borderline personality disorder: Secondary | ICD-10-CM | POA: Diagnosis not present

## 2020-05-17 DIAGNOSIS — J309 Allergic rhinitis, unspecified: Secondary | ICD-10-CM | POA: Diagnosis not present

## 2020-05-17 DIAGNOSIS — R05 Cough: Secondary | ICD-10-CM | POA: Diagnosis not present

## 2020-05-17 DIAGNOSIS — K219 Gastro-esophageal reflux disease without esophagitis: Secondary | ICD-10-CM | POA: Diagnosis not present

## 2020-05-27 DIAGNOSIS — F324 Major depressive disorder, single episode, in partial remission: Secondary | ICD-10-CM | POA: Diagnosis not present

## 2020-05-27 DIAGNOSIS — M810 Age-related osteoporosis without current pathological fracture: Secondary | ICD-10-CM | POA: Diagnosis not present

## 2020-05-27 DIAGNOSIS — I10 Essential (primary) hypertension: Secondary | ICD-10-CM | POA: Diagnosis not present

## 2020-05-27 DIAGNOSIS — F322 Major depressive disorder, single episode, severe without psychotic features: Secondary | ICD-10-CM | POA: Diagnosis not present

## 2020-05-27 DIAGNOSIS — F0390 Unspecified dementia without behavioral disturbance: Secondary | ICD-10-CM | POA: Diagnosis not present

## 2020-05-27 DIAGNOSIS — E7800 Pure hypercholesterolemia, unspecified: Secondary | ICD-10-CM | POA: Diagnosis not present

## 2020-06-27 DIAGNOSIS — F331 Major depressive disorder, recurrent, moderate: Secondary | ICD-10-CM | POA: Diagnosis not present

## 2020-07-17 DIAGNOSIS — E7800 Pure hypercholesterolemia, unspecified: Secondary | ICD-10-CM | POA: Diagnosis not present

## 2020-07-17 DIAGNOSIS — F324 Major depressive disorder, single episode, in partial remission: Secondary | ICD-10-CM | POA: Diagnosis not present

## 2020-07-17 DIAGNOSIS — I10 Essential (primary) hypertension: Secondary | ICD-10-CM | POA: Diagnosis not present

## 2020-07-17 DIAGNOSIS — F322 Major depressive disorder, single episode, severe without psychotic features: Secondary | ICD-10-CM | POA: Diagnosis not present

## 2020-07-17 DIAGNOSIS — M810 Age-related osteoporosis without current pathological fracture: Secondary | ICD-10-CM | POA: Diagnosis not present

## 2020-07-17 DIAGNOSIS — F0390 Unspecified dementia without behavioral disturbance: Secondary | ICD-10-CM | POA: Diagnosis not present

## 2020-07-25 DIAGNOSIS — F331 Major depressive disorder, recurrent, moderate: Secondary | ICD-10-CM | POA: Diagnosis not present

## 2020-07-30 DIAGNOSIS — F322 Major depressive disorder, single episode, severe without psychotic features: Secondary | ICD-10-CM | POA: Diagnosis not present

## 2020-07-30 DIAGNOSIS — K219 Gastro-esophageal reflux disease without esophagitis: Secondary | ICD-10-CM | POA: Diagnosis not present

## 2020-07-30 DIAGNOSIS — G259 Extrapyramidal and movement disorder, unspecified: Secondary | ICD-10-CM | POA: Diagnosis not present

## 2020-07-30 DIAGNOSIS — I10 Essential (primary) hypertension: Secondary | ICD-10-CM | POA: Diagnosis not present

## 2020-07-30 DIAGNOSIS — G2581 Restless legs syndrome: Secondary | ICD-10-CM | POA: Diagnosis not present

## 2020-07-30 DIAGNOSIS — F5104 Psychophysiologic insomnia: Secondary | ICD-10-CM | POA: Diagnosis not present

## 2020-07-30 DIAGNOSIS — F0390 Unspecified dementia without behavioral disturbance: Secondary | ICD-10-CM | POA: Diagnosis not present

## 2020-07-30 DIAGNOSIS — Z1389 Encounter for screening for other disorder: Secondary | ICD-10-CM | POA: Diagnosis not present

## 2020-07-30 DIAGNOSIS — G43909 Migraine, unspecified, not intractable, without status migrainosus: Secondary | ICD-10-CM | POA: Diagnosis not present

## 2020-07-30 DIAGNOSIS — R269 Unspecified abnormalities of gait and mobility: Secondary | ICD-10-CM | POA: Diagnosis not present

## 2020-07-30 DIAGNOSIS — Z0000 Encounter for general adult medical examination without abnormal findings: Secondary | ICD-10-CM | POA: Diagnosis not present

## 2020-07-30 DIAGNOSIS — G629 Polyneuropathy, unspecified: Secondary | ICD-10-CM | POA: Diagnosis not present

## 2020-07-30 DIAGNOSIS — E7800 Pure hypercholesterolemia, unspecified: Secondary | ICD-10-CM | POA: Diagnosis not present

## 2020-07-30 DIAGNOSIS — M810 Age-related osteoporosis without current pathological fracture: Secondary | ICD-10-CM | POA: Diagnosis not present

## 2020-08-07 ENCOUNTER — Other Ambulatory Visit: Payer: Self-pay | Admitting: Obstetrics and Gynecology

## 2020-08-07 DIAGNOSIS — Z1231 Encounter for screening mammogram for malignant neoplasm of breast: Secondary | ICD-10-CM

## 2020-08-19 DIAGNOSIS — F331 Major depressive disorder, recurrent, moderate: Secondary | ICD-10-CM | POA: Diagnosis not present

## 2020-08-22 DIAGNOSIS — F331 Major depressive disorder, recurrent, moderate: Secondary | ICD-10-CM | POA: Diagnosis not present

## 2020-08-26 DIAGNOSIS — F4312 Post-traumatic stress disorder, chronic: Secondary | ICD-10-CM | POA: Diagnosis not present

## 2020-08-26 DIAGNOSIS — F603 Borderline personality disorder: Secondary | ICD-10-CM | POA: Diagnosis not present

## 2020-08-26 DIAGNOSIS — F332 Major depressive disorder, recurrent severe without psychotic features: Secondary | ICD-10-CM | POA: Diagnosis not present

## 2020-08-29 DIAGNOSIS — F331 Major depressive disorder, recurrent, moderate: Secondary | ICD-10-CM | POA: Diagnosis not present

## 2020-09-04 ENCOUNTER — Ambulatory Visit: Payer: Medicare Other | Admitting: Psychology

## 2020-09-05 ENCOUNTER — Ambulatory Visit (INDEPENDENT_AMBULATORY_CARE_PROVIDER_SITE_OTHER): Payer: Medicare Other | Admitting: Psychology

## 2020-09-05 DIAGNOSIS — F332 Major depressive disorder, recurrent severe without psychotic features: Secondary | ICD-10-CM

## 2020-09-05 DIAGNOSIS — F419 Anxiety disorder, unspecified: Secondary | ICD-10-CM

## 2020-09-05 DIAGNOSIS — F603 Borderline personality disorder: Secondary | ICD-10-CM

## 2020-09-09 DIAGNOSIS — F331 Major depressive disorder, recurrent, moderate: Secondary | ICD-10-CM | POA: Diagnosis not present

## 2020-09-10 ENCOUNTER — Ambulatory Visit (INDEPENDENT_AMBULATORY_CARE_PROVIDER_SITE_OTHER): Payer: Medicare Other | Admitting: Sports Medicine

## 2020-09-10 ENCOUNTER — Other Ambulatory Visit: Payer: Self-pay

## 2020-09-10 VITALS — BP 114/86 | Ht 61.0 in | Wt 138.0 lb

## 2020-09-10 DIAGNOSIS — M25571 Pain in right ankle and joints of right foot: Secondary | ICD-10-CM | POA: Diagnosis not present

## 2020-09-10 NOTE — Patient Instructions (Signed)
  It was great seeing you today!  You have a strain of your posterior tibialis tendon.  We will show you a couple of exercises and I would recommend trying some topical Voltaren twice a day.  We will also try a compression sleeve on your ankle.  Wear your most comfortable pair of shoes for your race.    Good luck and let me know if you need anything else!

## 2020-09-11 ENCOUNTER — Encounter: Payer: Self-pay | Admitting: Sports Medicine

## 2020-09-11 DIAGNOSIS — Z01411 Encounter for gynecological examination (general) (routine) with abnormal findings: Secondary | ICD-10-CM | POA: Diagnosis not present

## 2020-09-11 DIAGNOSIS — L292 Pruritus vulvae: Secondary | ICD-10-CM | POA: Diagnosis not present

## 2020-09-11 NOTE — Progress Notes (Signed)
   Subjective:    Patient ID: Lisa Jackson, female    DOB: 11-28-1956, 64 y.o.   MRN: [id number]  HPI chief complaint: Right ankle pain  Lisa Jackson comes in today complaining of sudden onset right ankle pain that began during a race on Saturday.  Up until that point she had done very little in the way of competitive walking or running.  Pain is all along the medial aspect of the ankle.  She did not notice any swelling.  She denies any pain laterally.  She is still wearing her custom orthotics.  Interim medical history reviewed Medications reviewed Allergies reviewed    Review of Systems As above    Objective:   Physical Exam  Developed, well nourished.  No acute distress  [place] [facility] Exercise 09/10/2020  Frequency of aerobic exercise (# of days/week) 4  Average time in minutes 40  Frequency of strengthening activities (# of days/week) 0     Right ankle: Full range of motion.  No effusion.  No soft tissue swelling.  She is tender to palpation along the course of the posterior tibialis tendon.  She does have reproducible pain when standing on her tiptoes but the posterior tibialis tendon is competent.  She also has a blood blister along the lateral aspect of her great toe.  This is present on the left foot as well.       Assessment & Plan:   Left ankle pain likely secondary to posterior tibialis tendon strain.  Patient will continue with her custom orthotics and her most comfortable pair of shoes.  We will give her a body helix compression sleeve to wear when active.  I've also recommended using Voltaren gel twice daily and we have given her some home exercises to do.  She was recently told to lace her shoes differently so that she has more room in the toe box and she is wondering whether or not this may be contributing to her blood blisters.  I told her that this is possible and that she should play around with her lacing.  In the meantime she can protect her  blood blisters with Band-Aids until they are healed and then I recommend body glide in those areas for prevention in the future.  Follow-up with me for ongoing or recalcitrant issues.

## 2020-09-23 DIAGNOSIS — R52 Pain, unspecified: Secondary | ICD-10-CM | POA: Diagnosis not present

## 2020-09-23 DIAGNOSIS — R5383 Other fatigue: Secondary | ICD-10-CM | POA: Diagnosis not present

## 2020-09-23 DIAGNOSIS — R519 Headache, unspecified: Secondary | ICD-10-CM | POA: Diagnosis not present

## 2020-09-23 DIAGNOSIS — R059 Cough, unspecified: Secondary | ICD-10-CM | POA: Diagnosis not present

## 2020-09-24 DIAGNOSIS — F331 Major depressive disorder, recurrent, moderate: Secondary | ICD-10-CM | POA: Diagnosis not present

## 2020-10-09 ENCOUNTER — Other Ambulatory Visit: Payer: Self-pay

## 2020-10-09 ENCOUNTER — Ambulatory Visit (INDEPENDENT_AMBULATORY_CARE_PROVIDER_SITE_OTHER): Payer: Medicare Other

## 2020-10-09 DIAGNOSIS — Z1231 Encounter for screening mammogram for malignant neoplasm of breast: Secondary | ICD-10-CM | POA: Diagnosis not present

## 2020-10-10 ENCOUNTER — Ambulatory Visit (INDEPENDENT_AMBULATORY_CARE_PROVIDER_SITE_OTHER): Payer: Medicare Other | Admitting: Psychology

## 2020-10-10 DIAGNOSIS — F603 Borderline personality disorder: Secondary | ICD-10-CM

## 2020-10-10 DIAGNOSIS — F332 Major depressive disorder, recurrent severe without psychotic features: Secondary | ICD-10-CM

## 2020-10-10 DIAGNOSIS — F419 Anxiety disorder, unspecified: Secondary | ICD-10-CM

## 2020-10-15 DIAGNOSIS — F331 Major depressive disorder, recurrent, moderate: Secondary | ICD-10-CM | POA: Diagnosis not present

## 2020-10-17 ENCOUNTER — Ambulatory Visit (INDEPENDENT_AMBULATORY_CARE_PROVIDER_SITE_OTHER): Payer: Medicare Other | Admitting: Psychology

## 2020-10-17 DIAGNOSIS — F603 Borderline personality disorder: Secondary | ICD-10-CM

## 2020-10-17 DIAGNOSIS — F332 Major depressive disorder, recurrent severe without psychotic features: Secondary | ICD-10-CM

## 2020-10-17 DIAGNOSIS — F419 Anxiety disorder, unspecified: Secondary | ICD-10-CM | POA: Diagnosis not present

## 2020-10-21 DIAGNOSIS — F4312 Post-traumatic stress disorder, chronic: Secondary | ICD-10-CM | POA: Diagnosis not present

## 2020-10-21 DIAGNOSIS — F603 Borderline personality disorder: Secondary | ICD-10-CM | POA: Diagnosis not present

## 2020-10-21 DIAGNOSIS — F332 Major depressive disorder, recurrent severe without psychotic features: Secondary | ICD-10-CM | POA: Diagnosis not present

## 2020-11-07 ENCOUNTER — Ambulatory Visit: Payer: Federal, State, Local not specified - PPO | Admitting: Psychology

## 2020-11-07 DIAGNOSIS — F331 Major depressive disorder, recurrent, moderate: Secondary | ICD-10-CM | POA: Diagnosis not present

## 2020-11-10 DIAGNOSIS — Z23 Encounter for immunization: Secondary | ICD-10-CM | POA: Diagnosis not present

## 2020-11-11 DIAGNOSIS — F0280 Dementia in other diseases classified elsewhere without behavioral disturbance: Secondary | ICD-10-CM | POA: Diagnosis not present

## 2020-11-11 DIAGNOSIS — R413 Other amnesia: Secondary | ICD-10-CM | POA: Diagnosis not present

## 2020-11-11 DIAGNOSIS — R4189 Other symptoms and signs involving cognitive functions and awareness: Secondary | ICD-10-CM | POA: Diagnosis not present

## 2020-11-11 DIAGNOSIS — R269 Unspecified abnormalities of gait and mobility: Secondary | ICD-10-CM | POA: Diagnosis not present

## 2020-11-11 DIAGNOSIS — G255 Other chorea: Secondary | ICD-10-CM | POA: Diagnosis not present

## 2020-11-12 ENCOUNTER — Encounter: Payer: Self-pay | Admitting: Sports Medicine

## 2020-11-12 ENCOUNTER — Other Ambulatory Visit: Payer: Self-pay

## 2020-11-12 ENCOUNTER — Ambulatory Visit (INDEPENDENT_AMBULATORY_CARE_PROVIDER_SITE_OTHER): Payer: Medicare Other | Admitting: Sports Medicine

## 2020-11-12 VITALS — BP 114/84 | Ht 61.0 in | Wt 145.0 lb

## 2020-11-12 DIAGNOSIS — M76821 Posterior tibial tendinitis, right leg: Secondary | ICD-10-CM | POA: Diagnosis not present

## 2020-11-12 NOTE — Progress Notes (Signed)
   PCP: Wenda Low, MD  Subjective:   HPI: Patient is a 64 y.o. female here for reevaluation of right posterior ankle pain.  She was seen here on 09/10/2020 for this issue, diagnosed with right posterior tibialis tendinopathy.  She already had custom orthotics which she was encouraged to use as much as possible.  She was also given a by heel with compression sleeve and Voltaren gel.  She reports that the Voltaren gel was somewhat helpful but she has not been using it lately because she forgets.  She has had continued significant pain.  The pain remains in the posterior aspect of her medial ankle, sometimes radiating towards her Achilles.  She also has some medial midfoot pain as well.  She has not noticed any numbness or tingling, no significant weakness.  The pain is worse when she is going up on her toes, when she is walking, and doing water aerobics when she is pushing off the floor the pool.  No new trauma or injury.  Review of Systems:  Per HPI.   [place], medications and smoking status reviewed.      Objective:  Physical Exam:  [facility] Exercise 09/10/2020  Frequency of aerobic exercise (# of days/week) 4  Average time in minutes 40  Frequency of strengthening activities (# of days/week) 0     Gen: awake, alert, NAD, comfortable in exam room Pulm: breathing unlabored  Right foot: Inspection:  No obvious bony deformity.  No swelling, erythema, or bruising.  Longitudinal arch somewhat collapsed though remains preserved.  She has significant splaying of the first and second and second and third toe spaces on both sides more significant on the right. Palpation: Exquisitely tender to palpation just posterior to the medial malleolus ROM: Full  ROM of the ankle. Normal midfoot flexibility Strength: 5/5 strength ankle in all planes Neurovascular: N/V intact distally in the lower extremity Special tests: Negative anterior drawer. Negative squeeze. normal midfoot  flexibility. Normal calcaneal motion with heel raise  Limited ultrasound of right ankle: Medial malleolus visualized and without any signs of fracture or abnormality. Posterior tibialis tendon visualized just posterior to the medial malleolus.  Tendon appears intact without any significant tearing, however there is a significant amount of anechoic fluid surrounding the tendon.  Impression: -Significant peritendinous fluid suggestive of posterior tibialis tendinitis without tear   Assessment & Plan:  1.  Posterior tibialis tendinopathy  Patient with continued pain in the area of the posterior tibialis which is worse with plantar flexion, consistent with posterior tibialis tendinopathy which is failed conservative therapy thus far.  Ultrasound also shows findings consistent with this.  On evaluation of her gait with shoes on, she does have overpronation despite wearing orthotics.  She also has not been using Voltaren gel lately.  Plan: -Start physical therapy with focus on posterior tibialis tendinopathy, we will also focus on gait and balance -Scaphoid pads added to her current orthotics with improvement in her overpronation -Voltaren gel 3 times daily -Follow-up in 4 to 6 weeks   Dagoberto Ligas, MD [facility] 11/12/2020 10:51 AM  Patient seen and evaluated with the sports medicine fellow.  I agree with the above plan of care.  Ultrasound confirms posterior tibialis tendinitis/tendinosis.  Evaluation of her gait with orthotics in place still showed some pronation so we corrected this with some scaphoid pads.  She will start physical therapy at [facility] and will follow up with me again in 6 weeks.

## 2020-11-12 NOTE — Patient Instructions (Signed)
Thank you for coming in to see Lisa Jackson today! Please see below to review our plan for today's visit:   1.   Please start doing physical therapy for your posterior tibialis tendinitis. 2.   We placed a new arch support in your shoes today, please use these as much as possible until we follow-up. 3.   Please start using the topical Voltaren cream 2-3 times per day 4.  You may continue to use the ankle compression sleeve as needed for pain.  Let Lisa Jackson plan to follow-up in 4 to 6 weeks to reevaluate.   Please call the clinic at [phone] if your symptoms worsen or you have any concerns. It was our pleasure to serve you.       Dr. Dagoberto Ligas [facility]

## 2020-11-19 DIAGNOSIS — F603 Borderline personality disorder: Secondary | ICD-10-CM | POA: Diagnosis not present

## 2020-11-19 DIAGNOSIS — F4312 Post-traumatic stress disorder, chronic: Secondary | ICD-10-CM | POA: Diagnosis not present

## 2020-11-19 DIAGNOSIS — F332 Major depressive disorder, recurrent severe without psychotic features: Secondary | ICD-10-CM | POA: Diagnosis not present

## 2020-11-27 ENCOUNTER — Ambulatory Visit (INDEPENDENT_AMBULATORY_CARE_PROVIDER_SITE_OTHER): Payer: Medicare Other | Admitting: Psychology

## 2020-11-27 DIAGNOSIS — F603 Borderline personality disorder: Secondary | ICD-10-CM

## 2020-11-27 DIAGNOSIS — F419 Anxiety disorder, unspecified: Secondary | ICD-10-CM

## 2020-11-27 DIAGNOSIS — F332 Major depressive disorder, recurrent severe without psychotic features: Secondary | ICD-10-CM | POA: Diagnosis not present

## 2020-12-02 DIAGNOSIS — M25571 Pain in right ankle and joints of right foot: Secondary | ICD-10-CM | POA: Diagnosis not present

## 2020-12-02 DIAGNOSIS — M6281 Muscle weakness (generalized): Secondary | ICD-10-CM | POA: Diagnosis not present

## 2020-12-02 DIAGNOSIS — R262 Difficulty in walking, not elsewhere classified: Secondary | ICD-10-CM | POA: Diagnosis not present

## 2020-12-03 DIAGNOSIS — N3281 Overactive bladder: Secondary | ICD-10-CM | POA: Diagnosis not present

## 2020-12-03 DIAGNOSIS — N3941 Urge incontinence: Secondary | ICD-10-CM | POA: Diagnosis not present

## 2020-12-03 DIAGNOSIS — R350 Frequency of micturition: Secondary | ICD-10-CM | POA: Diagnosis not present

## 2020-12-04 DIAGNOSIS — R262 Difficulty in walking, not elsewhere classified: Secondary | ICD-10-CM | POA: Diagnosis not present

## 2020-12-04 DIAGNOSIS — M25571 Pain in right ankle and joints of right foot: Secondary | ICD-10-CM | POA: Diagnosis not present

## 2020-12-04 DIAGNOSIS — M6281 Muscle weakness (generalized): Secondary | ICD-10-CM | POA: Diagnosis not present

## 2020-12-10 ENCOUNTER — Ambulatory Visit (INDEPENDENT_AMBULATORY_CARE_PROVIDER_SITE_OTHER): Payer: Medicare Other | Admitting: Psychology

## 2020-12-10 DIAGNOSIS — F419 Anxiety disorder, unspecified: Secondary | ICD-10-CM

## 2020-12-10 DIAGNOSIS — F332 Major depressive disorder, recurrent severe without psychotic features: Secondary | ICD-10-CM | POA: Diagnosis not present

## 2020-12-10 DIAGNOSIS — F603 Borderline personality disorder: Secondary | ICD-10-CM | POA: Diagnosis not present

## 2020-12-11 DIAGNOSIS — M25571 Pain in right ankle and joints of right foot: Secondary | ICD-10-CM | POA: Diagnosis not present

## 2020-12-11 DIAGNOSIS — R262 Difficulty in walking, not elsewhere classified: Secondary | ICD-10-CM | POA: Diagnosis not present

## 2020-12-11 DIAGNOSIS — M6281 Muscle weakness (generalized): Secondary | ICD-10-CM | POA: Diagnosis not present

## 2020-12-12 DIAGNOSIS — M25571 Pain in right ankle and joints of right foot: Secondary | ICD-10-CM | POA: Diagnosis not present

## 2020-12-12 DIAGNOSIS — R262 Difficulty in walking, not elsewhere classified: Secondary | ICD-10-CM | POA: Diagnosis not present

## 2020-12-12 DIAGNOSIS — M6281 Muscle weakness (generalized): Secondary | ICD-10-CM | POA: Diagnosis not present

## 2020-12-19 DIAGNOSIS — R030 Elevated blood-pressure reading, without diagnosis of hypertension: Secondary | ICD-10-CM | POA: Diagnosis not present

## 2020-12-19 DIAGNOSIS — R079 Chest pain, unspecified: Secondary | ICD-10-CM | POA: Diagnosis not present

## 2020-12-19 DIAGNOSIS — R0602 Shortness of breath: Secondary | ICD-10-CM | POA: Diagnosis not present

## 2020-12-19 DIAGNOSIS — R519 Headache, unspecified: Secondary | ICD-10-CM | POA: Diagnosis not present

## 2020-12-20 DIAGNOSIS — R072 Precordial pain: Secondary | ICD-10-CM | POA: Diagnosis not present

## 2020-12-20 DIAGNOSIS — R030 Elevated blood-pressure reading, without diagnosis of hypertension: Secondary | ICD-10-CM | POA: Diagnosis not present

## 2020-12-20 DIAGNOSIS — R5383 Other fatigue: Secondary | ICD-10-CM | POA: Diagnosis not present

## 2020-12-20 DIAGNOSIS — R519 Headache, unspecified: Secondary | ICD-10-CM | POA: Diagnosis not present

## 2020-12-20 DIAGNOSIS — R0689 Other abnormalities of breathing: Secondary | ICD-10-CM | POA: Diagnosis not present

## 2020-12-23 DIAGNOSIS — I10 Essential (primary) hypertension: Secondary | ICD-10-CM | POA: Diagnosis not present

## 2020-12-25 ENCOUNTER — Ambulatory Visit (INDEPENDENT_AMBULATORY_CARE_PROVIDER_SITE_OTHER): Payer: Medicare Other | Admitting: Psychology

## 2020-12-25 DIAGNOSIS — F603 Borderline personality disorder: Secondary | ICD-10-CM | POA: Diagnosis not present

## 2020-12-25 DIAGNOSIS — F419 Anxiety disorder, unspecified: Secondary | ICD-10-CM | POA: Diagnosis not present

## 2020-12-25 DIAGNOSIS — F332 Major depressive disorder, recurrent severe without psychotic features: Secondary | ICD-10-CM

## 2020-12-26 ENCOUNTER — Ambulatory Visit (INDEPENDENT_AMBULATORY_CARE_PROVIDER_SITE_OTHER): Payer: Medicare Other | Admitting: Sports Medicine

## 2020-12-26 ENCOUNTER — Other Ambulatory Visit: Payer: Self-pay

## 2020-12-26 VITALS — BP 144/100 | Ht 61.0 in | Wt 140.0 lb

## 2020-12-26 DIAGNOSIS — M76821 Posterior tibial tendinitis, right leg: Secondary | ICD-10-CM | POA: Diagnosis not present

## 2020-12-26 DIAGNOSIS — R262 Difficulty in walking, not elsewhere classified: Secondary | ICD-10-CM | POA: Diagnosis not present

## 2020-12-26 DIAGNOSIS — M25571 Pain in right ankle and joints of right foot: Secondary | ICD-10-CM | POA: Diagnosis not present

## 2020-12-26 DIAGNOSIS — M6281 Muscle weakness (generalized): Secondary | ICD-10-CM | POA: Diagnosis not present

## 2020-12-26 NOTE — Progress Notes (Addendum)
   Subjective:    Patient ID: Lisa Jackson, female    DOB: [birth year], 66 y.o.   MRN: [id number]  HPI   Lisa Jackson comes in today for follow-up on right ankle posterior tibialis tendinitis.  She is making good improvement with physical therapy.  They have started laser treatments which have been helpful.  The modification to her orthotics has also been helpful.  She has been able to do some light walking but unfortunately her diastolic blood pressure has been high recently.  So she has not felt like doing much strenuous exercise.  She saw her PCP earlier this week and was restarted on Diovan.   Review of Systems As above    Objective:   Physical Exam  Well-developed, well-nourished.  No acute distress blood pressure is 144/100  Right ankle: Full range of motion.  There is no effusion.  No obvious soft tissue swelling.  She does remain tender to palpation along the posterior tibialis tendon just posterior to the medial malleolus.  She has reproducible pain but good strength with resisted inversion.  Good pulses.      Assessment & Plan:   Improving right ankle pain secondary to posterior tibialis tendinitis Hypertension  Lisa Jackson is making excellent progress in physical therapy.  I have encouraged her to continue with this until she is ready for discharge to a home exercise program.  She will follow-up with her PCP as scheduled for her hypertension.  Although it is likely not contributing to her hypertension, I have instructed her to discontinue her topical Voltaren gel.  Follow-up for ongoing or recalcitrant issues.

## 2021-01-01 DIAGNOSIS — L821 Other seborrheic keratosis: Secondary | ICD-10-CM | POA: Diagnosis not present

## 2021-01-01 DIAGNOSIS — L570 Actinic keratosis: Secondary | ICD-10-CM | POA: Diagnosis not present

## 2021-01-01 DIAGNOSIS — L578 Other skin changes due to chronic exposure to nonionizing radiation: Secondary | ICD-10-CM | POA: Diagnosis not present

## 2021-01-01 DIAGNOSIS — L814 Other melanin hyperpigmentation: Secondary | ICD-10-CM | POA: Diagnosis not present

## 2021-01-01 DIAGNOSIS — L853 Xerosis cutis: Secondary | ICD-10-CM | POA: Diagnosis not present

## 2021-01-01 DIAGNOSIS — D225 Melanocytic nevi of trunk: Secondary | ICD-10-CM | POA: Diagnosis not present

## 2021-01-01 DIAGNOSIS — Z85828 Personal history of other malignant neoplasm of skin: Secondary | ICD-10-CM | POA: Diagnosis not present

## 2021-01-02 DIAGNOSIS — M25571 Pain in right ankle and joints of right foot: Secondary | ICD-10-CM | POA: Diagnosis not present

## 2021-01-02 DIAGNOSIS — R262 Difficulty in walking, not elsewhere classified: Secondary | ICD-10-CM | POA: Diagnosis not present

## 2021-01-02 DIAGNOSIS — M6281 Muscle weakness (generalized): Secondary | ICD-10-CM | POA: Diagnosis not present

## 2021-01-07 DIAGNOSIS — R262 Difficulty in walking, not elsewhere classified: Secondary | ICD-10-CM | POA: Diagnosis not present

## 2021-01-07 DIAGNOSIS — M25561 Pain in right knee: Secondary | ICD-10-CM | POA: Diagnosis not present

## 2021-01-07 DIAGNOSIS — M6281 Muscle weakness (generalized): Secondary | ICD-10-CM | POA: Diagnosis not present

## 2021-01-07 DIAGNOSIS — M25571 Pain in right ankle and joints of right foot: Secondary | ICD-10-CM | POA: Diagnosis not present

## 2021-01-08 DIAGNOSIS — M25561 Pain in right knee: Secondary | ICD-10-CM | POA: Diagnosis not present

## 2021-01-08 DIAGNOSIS — M25571 Pain in right ankle and joints of right foot: Secondary | ICD-10-CM | POA: Diagnosis not present

## 2021-01-08 DIAGNOSIS — M6281 Muscle weakness (generalized): Secondary | ICD-10-CM | POA: Diagnosis not present

## 2021-01-08 DIAGNOSIS — R262 Difficulty in walking, not elsewhere classified: Secondary | ICD-10-CM | POA: Diagnosis not present

## 2021-01-09 DIAGNOSIS — F609 Personality disorder, unspecified: Secondary | ICD-10-CM | POA: Diagnosis not present

## 2021-01-09 DIAGNOSIS — F411 Generalized anxiety disorder: Secondary | ICD-10-CM | POA: Diagnosis not present

## 2021-01-09 DIAGNOSIS — F09 Unspecified mental disorder due to known physiological condition: Secondary | ICD-10-CM | POA: Diagnosis not present

## 2021-01-09 DIAGNOSIS — F330 Major depressive disorder, recurrent, mild: Secondary | ICD-10-CM | POA: Diagnosis not present

## 2021-01-14 DIAGNOSIS — M6281 Muscle weakness (generalized): Secondary | ICD-10-CM | POA: Diagnosis not present

## 2021-01-14 DIAGNOSIS — M25571 Pain in right ankle and joints of right foot: Secondary | ICD-10-CM | POA: Diagnosis not present

## 2021-01-14 DIAGNOSIS — M25561 Pain in right knee: Secondary | ICD-10-CM | POA: Diagnosis not present

## 2021-01-14 DIAGNOSIS — R262 Difficulty in walking, not elsewhere classified: Secondary | ICD-10-CM | POA: Diagnosis not present

## 2021-01-16 ENCOUNTER — Ambulatory Visit (INDEPENDENT_AMBULATORY_CARE_PROVIDER_SITE_OTHER): Payer: Medicare Other | Admitting: Psychology

## 2021-01-16 DIAGNOSIS — M25561 Pain in right knee: Secondary | ICD-10-CM | POA: Diagnosis not present

## 2021-01-16 DIAGNOSIS — R262 Difficulty in walking, not elsewhere classified: Secondary | ICD-10-CM | POA: Diagnosis not present

## 2021-01-16 DIAGNOSIS — M25571 Pain in right ankle and joints of right foot: Secondary | ICD-10-CM | POA: Diagnosis not present

## 2021-01-16 DIAGNOSIS — F331 Major depressive disorder, recurrent, moderate: Secondary | ICD-10-CM | POA: Diagnosis not present

## 2021-01-16 DIAGNOSIS — F419 Anxiety disorder, unspecified: Secondary | ICD-10-CM

## 2021-01-16 DIAGNOSIS — M6281 Muscle weakness (generalized): Secondary | ICD-10-CM | POA: Diagnosis not present

## 2021-01-16 DIAGNOSIS — F603 Borderline personality disorder: Secondary | ICD-10-CM

## 2021-01-23 DIAGNOSIS — M6281 Muscle weakness (generalized): Secondary | ICD-10-CM | POA: Diagnosis not present

## 2021-01-23 DIAGNOSIS — M25561 Pain in right knee: Secondary | ICD-10-CM | POA: Diagnosis not present

## 2021-01-23 DIAGNOSIS — R262 Difficulty in walking, not elsewhere classified: Secondary | ICD-10-CM | POA: Diagnosis not present

## 2021-01-23 DIAGNOSIS — M25571 Pain in right ankle and joints of right foot: Secondary | ICD-10-CM | POA: Diagnosis not present

## 2021-01-28 DIAGNOSIS — M25571 Pain in right ankle and joints of right foot: Secondary | ICD-10-CM | POA: Diagnosis not present

## 2021-01-28 DIAGNOSIS — M6281 Muscle weakness (generalized): Secondary | ICD-10-CM | POA: Diagnosis not present

## 2021-01-28 DIAGNOSIS — M25561 Pain in right knee: Secondary | ICD-10-CM | POA: Diagnosis not present

## 2021-01-28 DIAGNOSIS — R262 Difficulty in walking, not elsewhere classified: Secondary | ICD-10-CM | POA: Diagnosis not present

## 2021-01-29 DIAGNOSIS — M25561 Pain in right knee: Secondary | ICD-10-CM | POA: Diagnosis not present

## 2021-01-29 DIAGNOSIS — M25571 Pain in right ankle and joints of right foot: Secondary | ICD-10-CM | POA: Diagnosis not present

## 2021-01-29 DIAGNOSIS — R262 Difficulty in walking, not elsewhere classified: Secondary | ICD-10-CM | POA: Diagnosis not present

## 2021-01-29 DIAGNOSIS — M6281 Muscle weakness (generalized): Secondary | ICD-10-CM | POA: Diagnosis not present

## 2021-01-30 DIAGNOSIS — M810 Age-related osteoporosis without current pathological fracture: Secondary | ICD-10-CM | POA: Diagnosis not present

## 2021-01-30 DIAGNOSIS — I10 Essential (primary) hypertension: Secondary | ICD-10-CM | POA: Diagnosis not present

## 2021-01-30 DIAGNOSIS — G2581 Restless legs syndrome: Secondary | ICD-10-CM | POA: Diagnosis not present

## 2021-01-30 DIAGNOSIS — F0390 Unspecified dementia without behavioral disturbance: Secondary | ICD-10-CM | POA: Diagnosis not present

## 2021-01-30 DIAGNOSIS — E7800 Pure hypercholesterolemia, unspecified: Secondary | ICD-10-CM | POA: Diagnosis not present

## 2021-01-30 DIAGNOSIS — G259 Extrapyramidal and movement disorder, unspecified: Secondary | ICD-10-CM | POA: Diagnosis not present

## 2021-01-30 DIAGNOSIS — F331 Major depressive disorder, recurrent, moderate: Secondary | ICD-10-CM | POA: Diagnosis not present

## 2021-01-30 DIAGNOSIS — G43909 Migraine, unspecified, not intractable, without status migrainosus: Secondary | ICD-10-CM | POA: Diagnosis not present

## 2021-01-30 DIAGNOSIS — K219 Gastro-esophageal reflux disease without esophagitis: Secondary | ICD-10-CM | POA: Diagnosis not present

## 2021-02-03 ENCOUNTER — Ambulatory Visit (INDEPENDENT_AMBULATORY_CARE_PROVIDER_SITE_OTHER): Payer: Medicare Other | Admitting: Psychology

## 2021-02-03 DIAGNOSIS — R262 Difficulty in walking, not elsewhere classified: Secondary | ICD-10-CM | POA: Diagnosis not present

## 2021-02-03 DIAGNOSIS — F332 Major depressive disorder, recurrent severe without psychotic features: Secondary | ICD-10-CM

## 2021-02-03 DIAGNOSIS — F419 Anxiety disorder, unspecified: Secondary | ICD-10-CM

## 2021-02-03 DIAGNOSIS — M6281 Muscle weakness (generalized): Secondary | ICD-10-CM | POA: Diagnosis not present

## 2021-02-03 DIAGNOSIS — M25571 Pain in right ankle and joints of right foot: Secondary | ICD-10-CM | POA: Diagnosis not present

## 2021-02-03 DIAGNOSIS — M25561 Pain in right knee: Secondary | ICD-10-CM | POA: Diagnosis not present

## 2021-02-03 DIAGNOSIS — F603 Borderline personality disorder: Secondary | ICD-10-CM

## 2021-02-06 DIAGNOSIS — M25571 Pain in right ankle and joints of right foot: Secondary | ICD-10-CM | POA: Diagnosis not present

## 2021-02-06 DIAGNOSIS — M25561 Pain in right knee: Secondary | ICD-10-CM | POA: Diagnosis not present

## 2021-02-06 DIAGNOSIS — M6281 Muscle weakness (generalized): Secondary | ICD-10-CM | POA: Diagnosis not present

## 2021-02-06 DIAGNOSIS — R262 Difficulty in walking, not elsewhere classified: Secondary | ICD-10-CM | POA: Diagnosis not present

## 2021-02-12 DIAGNOSIS — R262 Difficulty in walking, not elsewhere classified: Secondary | ICD-10-CM | POA: Diagnosis not present

## 2021-02-12 DIAGNOSIS — M25561 Pain in right knee: Secondary | ICD-10-CM | POA: Diagnosis not present

## 2021-02-12 DIAGNOSIS — M6281 Muscle weakness (generalized): Secondary | ICD-10-CM | POA: Diagnosis not present

## 2021-02-12 DIAGNOSIS — M25571 Pain in right ankle and joints of right foot: Secondary | ICD-10-CM | POA: Diagnosis not present

## 2021-02-17 DIAGNOSIS — M25571 Pain in right ankle and joints of right foot: Secondary | ICD-10-CM | POA: Diagnosis not present

## 2021-02-17 DIAGNOSIS — M25561 Pain in right knee: Secondary | ICD-10-CM | POA: Diagnosis not present

## 2021-02-17 DIAGNOSIS — R262 Difficulty in walking, not elsewhere classified: Secondary | ICD-10-CM | POA: Diagnosis not present

## 2021-02-17 DIAGNOSIS — M6281 Muscle weakness (generalized): Secondary | ICD-10-CM | POA: Diagnosis not present

## 2021-02-19 DIAGNOSIS — F330 Major depressive disorder, recurrent, mild: Secondary | ICD-10-CM | POA: Diagnosis not present

## 2021-02-19 DIAGNOSIS — F411 Generalized anxiety disorder: Secondary | ICD-10-CM | POA: Diagnosis not present

## 2021-02-19 DIAGNOSIS — F609 Personality disorder, unspecified: Secondary | ICD-10-CM | POA: Diagnosis not present

## 2021-02-19 DIAGNOSIS — F09 Unspecified mental disorder due to known physiological condition: Secondary | ICD-10-CM | POA: Diagnosis not present

## 2021-02-24 ENCOUNTER — Ambulatory Visit (INDEPENDENT_AMBULATORY_CARE_PROVIDER_SITE_OTHER): Payer: Medicare Other | Admitting: Psychology

## 2021-02-24 DIAGNOSIS — F603 Borderline personality disorder: Secondary | ICD-10-CM

## 2021-02-24 DIAGNOSIS — F419 Anxiety disorder, unspecified: Secondary | ICD-10-CM

## 2021-02-24 DIAGNOSIS — F332 Major depressive disorder, recurrent severe without psychotic features: Secondary | ICD-10-CM | POA: Diagnosis not present

## 2021-02-27 DIAGNOSIS — M25571 Pain in right ankle and joints of right foot: Secondary | ICD-10-CM | POA: Diagnosis not present

## 2021-02-27 DIAGNOSIS — M25561 Pain in right knee: Secondary | ICD-10-CM | POA: Diagnosis not present

## 2021-02-27 DIAGNOSIS — M6281 Muscle weakness (generalized): Secondary | ICD-10-CM | POA: Diagnosis not present

## 2021-02-27 DIAGNOSIS — R262 Difficulty in walking, not elsewhere classified: Secondary | ICD-10-CM | POA: Diagnosis not present

## 2021-03-03 DIAGNOSIS — M25571 Pain in right ankle and joints of right foot: Secondary | ICD-10-CM | POA: Diagnosis not present

## 2021-03-03 DIAGNOSIS — R262 Difficulty in walking, not elsewhere classified: Secondary | ICD-10-CM | POA: Diagnosis not present

## 2021-03-03 DIAGNOSIS — M25561 Pain in right knee: Secondary | ICD-10-CM | POA: Diagnosis not present

## 2021-03-03 DIAGNOSIS — M6281 Muscle weakness (generalized): Secondary | ICD-10-CM | POA: Diagnosis not present

## 2021-03-12 DIAGNOSIS — M6281 Muscle weakness (generalized): Secondary | ICD-10-CM | POA: Diagnosis not present

## 2021-03-12 DIAGNOSIS — M25561 Pain in right knee: Secondary | ICD-10-CM | POA: Diagnosis not present

## 2021-03-12 DIAGNOSIS — M25571 Pain in right ankle and joints of right foot: Secondary | ICD-10-CM | POA: Diagnosis not present

## 2021-03-12 DIAGNOSIS — R262 Difficulty in walking, not elsewhere classified: Secondary | ICD-10-CM | POA: Diagnosis not present

## 2021-03-20 ENCOUNTER — Ambulatory Visit: Payer: Medicare Other | Admitting: Psychology

## 2021-03-24 ENCOUNTER — Ambulatory Visit (INDEPENDENT_AMBULATORY_CARE_PROVIDER_SITE_OTHER): Payer: Medicare Other | Admitting: Psychology

## 2021-03-24 DIAGNOSIS — F419 Anxiety disorder, unspecified: Secondary | ICD-10-CM

## 2021-03-24 DIAGNOSIS — F332 Major depressive disorder, recurrent severe without psychotic features: Secondary | ICD-10-CM | POA: Diagnosis not present

## 2021-03-24 DIAGNOSIS — F603 Borderline personality disorder: Secondary | ICD-10-CM

## 2021-04-02 DIAGNOSIS — J019 Acute sinusitis, unspecified: Secondary | ICD-10-CM | POA: Diagnosis not present

## 2021-04-21 DIAGNOSIS — H1032 Unspecified acute conjunctivitis, left eye: Secondary | ICD-10-CM | POA: Diagnosis not present

## 2021-04-22 ENCOUNTER — Ambulatory Visit: Payer: Medicare Other | Admitting: Psychology

## 2021-04-23 DIAGNOSIS — Z789 Other specified health status: Secondary | ICD-10-CM | POA: Diagnosis not present

## 2021-04-23 DIAGNOSIS — R509 Fever, unspecified: Secondary | ICD-10-CM | POA: Diagnosis not present

## 2021-04-23 DIAGNOSIS — Z03818 Encounter for observation for suspected exposure to other biological agents ruled out: Secondary | ICD-10-CM | POA: Diagnosis not present

## 2021-04-23 DIAGNOSIS — R059 Cough, unspecified: Secondary | ICD-10-CM | POA: Diagnosis not present

## 2021-04-24 ENCOUNTER — Ambulatory Visit: Payer: Medicare Other | Admitting: Psychology

## 2021-04-29 ENCOUNTER — Other Ambulatory Visit: Payer: Self-pay | Admitting: Internal Medicine

## 2021-04-29 ENCOUNTER — Ambulatory Visit (INDEPENDENT_AMBULATORY_CARE_PROVIDER_SITE_OTHER): Payer: Medicare Other | Admitting: Psychology

## 2021-04-29 ENCOUNTER — Ambulatory Visit
Admission: RE | Admit: 2021-04-29 | Discharge: 2021-04-29 | Disposition: A | Discharge status: Home / Self Care | Payer: Medicare Other | Source: Ambulatory Visit | Attending: Internal Medicine | Admitting: Internal Medicine

## 2021-04-29 DIAGNOSIS — J309 Allergic rhinitis, unspecified: Secondary | ICD-10-CM | POA: Diagnosis not present

## 2021-04-29 DIAGNOSIS — F603 Borderline personality disorder: Secondary | ICD-10-CM

## 2021-04-29 DIAGNOSIS — R059 Cough, unspecified: Secondary | ICD-10-CM

## 2021-04-29 DIAGNOSIS — F332 Major depressive disorder, recurrent severe without psychotic features: Secondary | ICD-10-CM | POA: Diagnosis not present

## 2021-04-29 DIAGNOSIS — F419 Anxiety disorder, unspecified: Secondary | ICD-10-CM

## 2021-05-12 DIAGNOSIS — G255 Other chorea: Secondary | ICD-10-CM | POA: Diagnosis not present

## 2021-05-12 DIAGNOSIS — Z8669 Personal history of other diseases of the nervous system and sense organs: Secondary | ICD-10-CM | POA: Diagnosis not present

## 2021-05-12 DIAGNOSIS — G4710 Hypersomnia, unspecified: Secondary | ICD-10-CM | POA: Diagnosis not present

## 2021-05-12 DIAGNOSIS — R0683 Snoring: Secondary | ICD-10-CM | POA: Diagnosis not present

## 2021-05-12 DIAGNOSIS — R413 Other amnesia: Secondary | ICD-10-CM | POA: Diagnosis not present

## 2021-05-14 ENCOUNTER — Ambulatory Visit (INDEPENDENT_AMBULATORY_CARE_PROVIDER_SITE_OTHER): Payer: Medicare Other | Admitting: Psychology

## 2021-05-14 DIAGNOSIS — F332 Major depressive disorder, recurrent severe without psychotic features: Secondary | ICD-10-CM | POA: Diagnosis not present

## 2021-05-14 DIAGNOSIS — F603 Borderline personality disorder: Secondary | ICD-10-CM | POA: Diagnosis not present

## 2021-05-14 DIAGNOSIS — F419 Anxiety disorder, unspecified: Secondary | ICD-10-CM

## 2021-05-15 DIAGNOSIS — F330 Major depressive disorder, recurrent, mild: Secondary | ICD-10-CM | POA: Diagnosis not present

## 2021-05-15 DIAGNOSIS — F411 Generalized anxiety disorder: Secondary | ICD-10-CM | POA: Diagnosis not present

## 2021-05-15 DIAGNOSIS — F609 Personality disorder, unspecified: Secondary | ICD-10-CM | POA: Diagnosis not present

## 2021-05-15 DIAGNOSIS — F09 Unspecified mental disorder due to known physiological condition: Secondary | ICD-10-CM | POA: Diagnosis not present

## 2021-06-04 ENCOUNTER — Ambulatory Visit (INDEPENDENT_AMBULATORY_CARE_PROVIDER_SITE_OTHER): Payer: Medicare Other | Admitting: Psychology

## 2021-06-04 DIAGNOSIS — F332 Major depressive disorder, recurrent severe without psychotic features: Secondary | ICD-10-CM | POA: Diagnosis not present

## 2021-06-04 DIAGNOSIS — F603 Borderline personality disorder: Secondary | ICD-10-CM | POA: Diagnosis not present

## 2021-06-04 DIAGNOSIS — F419 Anxiety disorder, unspecified: Secondary | ICD-10-CM

## 2021-06-04 DIAGNOSIS — S0501XA Injury of conjunctiva and corneal abrasion without foreign body, right eye, initial encounter: Secondary | ICD-10-CM | POA: Diagnosis not present

## 2021-06-18 ENCOUNTER — Ambulatory Visit (INDEPENDENT_AMBULATORY_CARE_PROVIDER_SITE_OTHER): Payer: Medicare Other | Admitting: Psychology

## 2021-06-18 DIAGNOSIS — F603 Borderline personality disorder: Secondary | ICD-10-CM

## 2021-06-18 DIAGNOSIS — F419 Anxiety disorder, unspecified: Secondary | ICD-10-CM | POA: Diagnosis not present

## 2021-06-18 DIAGNOSIS — F332 Major depressive disorder, recurrent severe without psychotic features: Secondary | ICD-10-CM | POA: Diagnosis not present

## 2021-06-20 DIAGNOSIS — Z7689 Persons encountering health services in other specified circumstances: Secondary | ICD-10-CM | POA: Diagnosis not present

## 2021-06-20 DIAGNOSIS — N898 Other specified noninflammatory disorders of vagina: Secondary | ICD-10-CM | POA: Diagnosis not present

## 2021-06-25 ENCOUNTER — Ambulatory Visit (INDEPENDENT_AMBULATORY_CARE_PROVIDER_SITE_OTHER): Payer: Medicare Other | Admitting: Psychology

## 2021-06-25 DIAGNOSIS — F419 Anxiety disorder, unspecified: Secondary | ICD-10-CM

## 2021-06-25 DIAGNOSIS — F332 Major depressive disorder, recurrent severe without psychotic features: Secondary | ICD-10-CM | POA: Diagnosis not present

## 2021-06-25 DIAGNOSIS — F603 Borderline personality disorder: Secondary | ICD-10-CM

## 2021-07-01 DIAGNOSIS — L812 Freckles: Secondary | ICD-10-CM | POA: Diagnosis not present

## 2021-07-01 DIAGNOSIS — C44629 Squamous cell carcinoma of skin of left upper limb, including shoulder: Secondary | ICD-10-CM | POA: Diagnosis not present

## 2021-07-01 DIAGNOSIS — D485 Neoplasm of uncertain behavior of skin: Secondary | ICD-10-CM | POA: Diagnosis not present

## 2021-07-01 DIAGNOSIS — Z85828 Personal history of other malignant neoplasm of skin: Secondary | ICD-10-CM | POA: Diagnosis not present

## 2021-07-01 DIAGNOSIS — L821 Other seborrheic keratosis: Secondary | ICD-10-CM | POA: Diagnosis not present

## 2021-07-11 DIAGNOSIS — N7689 Other specified inflammation of vagina and vulva: Secondary | ICD-10-CM | POA: Diagnosis not present

## 2021-07-11 DIAGNOSIS — N762 Acute vulvitis: Secondary | ICD-10-CM | POA: Diagnosis not present

## 2021-08-01 DIAGNOSIS — F339 Major depressive disorder, recurrent, unspecified: Secondary | ICD-10-CM | POA: Diagnosis not present

## 2021-08-01 DIAGNOSIS — Z9151 Personal history of suicidal behavior: Secondary | ICD-10-CM | POA: Diagnosis not present

## 2021-08-01 DIAGNOSIS — F419 Anxiety disorder, unspecified: Secondary | ICD-10-CM | POA: Diagnosis not present

## 2021-08-04 DIAGNOSIS — G43909 Migraine, unspecified, not intractable, without status migrainosus: Secondary | ICD-10-CM | POA: Diagnosis not present

## 2021-08-04 DIAGNOSIS — K219 Gastro-esophageal reflux disease without esophagitis: Secondary | ICD-10-CM | POA: Diagnosis not present

## 2021-08-04 DIAGNOSIS — Z0000 Encounter for general adult medical examination without abnormal findings: Secondary | ICD-10-CM | POA: Diagnosis not present

## 2021-08-04 DIAGNOSIS — F331 Major depressive disorder, recurrent, moderate: Secondary | ICD-10-CM | POA: Diagnosis not present

## 2021-08-04 DIAGNOSIS — G629 Polyneuropathy, unspecified: Secondary | ICD-10-CM | POA: Diagnosis not present

## 2021-08-04 DIAGNOSIS — M810 Age-related osteoporosis without current pathological fracture: Secondary | ICD-10-CM | POA: Diagnosis not present

## 2021-08-04 DIAGNOSIS — M797 Fibromyalgia: Secondary | ICD-10-CM | POA: Diagnosis not present

## 2021-08-04 DIAGNOSIS — Z1389 Encounter for screening for other disorder: Secondary | ICD-10-CM | POA: Diagnosis not present

## 2021-08-04 DIAGNOSIS — I10 Essential (primary) hypertension: Secondary | ICD-10-CM | POA: Diagnosis not present

## 2021-08-04 DIAGNOSIS — G2581 Restless legs syndrome: Secondary | ICD-10-CM | POA: Diagnosis not present

## 2021-08-04 DIAGNOSIS — R269 Unspecified abnormalities of gait and mobility: Secondary | ICD-10-CM | POA: Diagnosis not present

## 2021-08-04 DIAGNOSIS — F0390 Unspecified dementia without behavioral disturbance: Secondary | ICD-10-CM | POA: Diagnosis not present

## 2021-08-04 DIAGNOSIS — E7800 Pure hypercholesterolemia, unspecified: Secondary | ICD-10-CM | POA: Diagnosis not present

## 2021-08-05 DIAGNOSIS — F411 Generalized anxiety disorder: Secondary | ICD-10-CM | POA: Diagnosis not present

## 2021-08-05 DIAGNOSIS — F330 Major depressive disorder, recurrent, mild: Secondary | ICD-10-CM | POA: Diagnosis not present

## 2021-08-05 DIAGNOSIS — F09 Unspecified mental disorder due to known physiological condition: Secondary | ICD-10-CM | POA: Diagnosis not present

## 2021-08-05 DIAGNOSIS — F609 Personality disorder, unspecified: Secondary | ICD-10-CM | POA: Diagnosis not present

## 2021-08-11 ENCOUNTER — Other Ambulatory Visit: Payer: Self-pay | Admitting: Internal Medicine

## 2021-08-11 DIAGNOSIS — Z780 Asymptomatic menopausal state: Secondary | ICD-10-CM

## 2021-08-11 DIAGNOSIS — M810 Age-related osteoporosis without current pathological fracture: Secondary | ICD-10-CM

## 2021-08-12 ENCOUNTER — Other Ambulatory Visit: Payer: Self-pay | Admitting: Obstetrics and Gynecology

## 2021-08-12 DIAGNOSIS — Z1231 Encounter for screening mammogram for malignant neoplasm of breast: Secondary | ICD-10-CM

## 2021-08-19 DIAGNOSIS — Z85828 Personal history of other malignant neoplasm of skin: Secondary | ICD-10-CM | POA: Diagnosis not present

## 2021-08-19 DIAGNOSIS — L239 Allergic contact dermatitis, unspecified cause: Secondary | ICD-10-CM | POA: Diagnosis not present

## 2021-08-23 DIAGNOSIS — L247 Irritant contact dermatitis due to plants, except food: Secondary | ICD-10-CM | POA: Diagnosis not present

## 2021-08-25 DIAGNOSIS — F339 Major depressive disorder, recurrent, unspecified: Secondary | ICD-10-CM | POA: Diagnosis not present

## 2021-08-25 DIAGNOSIS — F419 Anxiety disorder, unspecified: Secondary | ICD-10-CM | POA: Diagnosis not present

## 2021-08-26 DIAGNOSIS — I10 Essential (primary) hypertension: Secondary | ICD-10-CM | POA: Diagnosis not present

## 2021-09-10 DIAGNOSIS — F339 Major depressive disorder, recurrent, unspecified: Secondary | ICD-10-CM | POA: Diagnosis not present

## 2021-09-10 DIAGNOSIS — F419 Anxiety disorder, unspecified: Secondary | ICD-10-CM | POA: Diagnosis not present

## 2021-09-19 DIAGNOSIS — R21 Rash and other nonspecific skin eruption: Secondary | ICD-10-CM | POA: Diagnosis not present

## 2021-09-24 DIAGNOSIS — N7689 Other specified inflammation of vagina and vulva: Secondary | ICD-10-CM | POA: Diagnosis not present

## 2021-09-24 DIAGNOSIS — Z01419 Encounter for gynecological examination (general) (routine) without abnormal findings: Secondary | ICD-10-CM | POA: Diagnosis not present

## 2021-09-24 DIAGNOSIS — N952 Postmenopausal atrophic vaginitis: Secondary | ICD-10-CM | POA: Diagnosis not present

## 2021-09-27 DIAGNOSIS — F339 Major depressive disorder, recurrent, unspecified: Secondary | ICD-10-CM | POA: Diagnosis not present

## 2021-09-27 DIAGNOSIS — F419 Anxiety disorder, unspecified: Secondary | ICD-10-CM | POA: Diagnosis not present

## 2021-10-06 DIAGNOSIS — M7582 Other shoulder lesions, left shoulder: Secondary | ICD-10-CM | POA: Diagnosis not present

## 2021-10-14 DIAGNOSIS — J069 Acute upper respiratory infection, unspecified: Secondary | ICD-10-CM | POA: Diagnosis not present

## 2021-10-15 ENCOUNTER — Ambulatory Visit (INDEPENDENT_AMBULATORY_CARE_PROVIDER_SITE_OTHER): Payer: Medicare Other

## 2021-10-15 ENCOUNTER — Ambulatory Visit: Payer: Medicare Other

## 2021-10-15 ENCOUNTER — Other Ambulatory Visit: Payer: Self-pay

## 2021-10-15 DIAGNOSIS — Z1231 Encounter for screening mammogram for malignant neoplasm of breast: Secondary | ICD-10-CM | POA: Diagnosis not present

## 2021-10-15 DIAGNOSIS — Z780 Asymptomatic menopausal state: Secondary | ICD-10-CM | POA: Diagnosis not present

## 2021-10-15 DIAGNOSIS — M810 Age-related osteoporosis without current pathological fracture: Secondary | ICD-10-CM | POA: Diagnosis not present

## 2021-10-15 DIAGNOSIS — M85851 Other specified disorders of bone density and structure, right thigh: Secondary | ICD-10-CM | POA: Diagnosis not present

## 2021-10-30 DIAGNOSIS — F09 Unspecified mental disorder due to known physiological condition: Secondary | ICD-10-CM | POA: Diagnosis not present

## 2021-10-30 DIAGNOSIS — F411 Generalized anxiety disorder: Secondary | ICD-10-CM | POA: Diagnosis not present

## 2021-10-30 DIAGNOSIS — F330 Major depressive disorder, recurrent, mild: Secondary | ICD-10-CM | POA: Diagnosis not present

## 2021-10-30 DIAGNOSIS — F609 Personality disorder, unspecified: Secondary | ICD-10-CM | POA: Diagnosis not present

## 2021-11-06 DIAGNOSIS — I10 Essential (primary) hypertension: Secondary | ICD-10-CM | POA: Diagnosis not present

## 2021-11-06 DIAGNOSIS — J069 Acute upper respiratory infection, unspecified: Secondary | ICD-10-CM | POA: Diagnosis not present

## 2021-11-06 DIAGNOSIS — U071 COVID-19: Secondary | ICD-10-CM | POA: Diagnosis not present

## 2021-11-12 DIAGNOSIS — R0981 Nasal congestion: Secondary | ICD-10-CM | POA: Diagnosis not present

## 2021-11-12 DIAGNOSIS — B9689 Other specified bacterial agents as the cause of diseases classified elsewhere: Secondary | ICD-10-CM | POA: Diagnosis not present

## 2021-11-12 DIAGNOSIS — J028 Acute pharyngitis due to other specified organisms: Secondary | ICD-10-CM | POA: Diagnosis not present

## 2021-11-12 DIAGNOSIS — B370 Candidal stomatitis: Secondary | ICD-10-CM | POA: Diagnosis not present

## 2021-11-18 DIAGNOSIS — U071 COVID-19: Secondary | ICD-10-CM | POA: Diagnosis not present

## 2021-11-18 DIAGNOSIS — R051 Acute cough: Secondary | ICD-10-CM | POA: Diagnosis not present

## 2021-11-18 DIAGNOSIS — G4489 Other headache syndrome: Secondary | ICD-10-CM | POA: Diagnosis not present

## 2021-12-04 DIAGNOSIS — G2581 Restless legs syndrome: Secondary | ICD-10-CM | POA: Diagnosis not present

## 2021-12-04 DIAGNOSIS — I10 Essential (primary) hypertension: Secondary | ICD-10-CM | POA: Diagnosis not present

## 2021-12-04 DIAGNOSIS — G4733 Obstructive sleep apnea (adult) (pediatric): Secondary | ICD-10-CM | POA: Diagnosis not present

## 2021-12-04 DIAGNOSIS — F5104 Psychophysiologic insomnia: Secondary | ICD-10-CM | POA: Diagnosis not present

## 2021-12-04 DIAGNOSIS — G629 Polyneuropathy, unspecified: Secondary | ICD-10-CM | POA: Diagnosis not present

## 2021-12-04 DIAGNOSIS — M810 Age-related osteoporosis without current pathological fracture: Secondary | ICD-10-CM | POA: Diagnosis not present

## 2021-12-04 DIAGNOSIS — F0390 Unspecified dementia without behavioral disturbance: Secondary | ICD-10-CM | POA: Diagnosis not present

## 2021-12-04 DIAGNOSIS — E7800 Pure hypercholesterolemia, unspecified: Secondary | ICD-10-CM | POA: Diagnosis not present

## 2021-12-04 DIAGNOSIS — Z23 Encounter for immunization: Secondary | ICD-10-CM | POA: Diagnosis not present

## 2021-12-04 DIAGNOSIS — F331 Major depressive disorder, recurrent, moderate: Secondary | ICD-10-CM | POA: Diagnosis not present

## 2021-12-04 DIAGNOSIS — G43909 Migraine, unspecified, not intractable, without status migrainosus: Secondary | ICD-10-CM | POA: Diagnosis not present

## 2021-12-05 DIAGNOSIS — N3281 Overactive bladder: Secondary | ICD-10-CM | POA: Diagnosis not present

## 2021-12-05 DIAGNOSIS — R350 Frequency of micturition: Secondary | ICD-10-CM | POA: Diagnosis not present

## 2021-12-29 DIAGNOSIS — D2272 Melanocytic nevi of left lower limb, including hip: Secondary | ICD-10-CM | POA: Diagnosis not present

## 2021-12-29 DIAGNOSIS — D2271 Melanocytic nevi of right lower limb, including hip: Secondary | ICD-10-CM | POA: Diagnosis not present

## 2021-12-29 DIAGNOSIS — Z85828 Personal history of other malignant neoplasm of skin: Secondary | ICD-10-CM | POA: Diagnosis not present

## 2021-12-29 DIAGNOSIS — L812 Freckles: Secondary | ICD-10-CM | POA: Diagnosis not present

## 2021-12-29 DIAGNOSIS — L304 Erythema intertrigo: Secondary | ICD-10-CM | POA: Diagnosis not present

## 2021-12-29 DIAGNOSIS — L570 Actinic keratosis: Secondary | ICD-10-CM | POA: Diagnosis not present

## 2022-01-05 DIAGNOSIS — B354 Tinea corporis: Secondary | ICD-10-CM | POA: Diagnosis not present

## 2022-01-28 DIAGNOSIS — R300 Dysuria: Secondary | ICD-10-CM | POA: Diagnosis not present

## 2022-01-28 DIAGNOSIS — N7689 Other specified inflammation of vagina and vulva: Secondary | ICD-10-CM | POA: Diagnosis not present

## 2022-01-30 DIAGNOSIS — F09 Unspecified mental disorder due to known physiological condition: Secondary | ICD-10-CM | POA: Diagnosis not present

## 2022-01-30 DIAGNOSIS — F609 Personality disorder, unspecified: Secondary | ICD-10-CM | POA: Diagnosis not present

## 2022-01-30 DIAGNOSIS — F411 Generalized anxiety disorder: Secondary | ICD-10-CM | POA: Diagnosis not present

## 2022-01-30 DIAGNOSIS — F330 Major depressive disorder, recurrent, mild: Secondary | ICD-10-CM | POA: Diagnosis not present

## 2022-02-03 ENCOUNTER — Ambulatory Visit (INDEPENDENT_AMBULATORY_CARE_PROVIDER_SITE_OTHER): Payer: Medicare Other | Admitting: Sports Medicine

## 2022-02-03 VITALS — BP 122/84 | Ht 61.0 in | Wt 141.0 lb

## 2022-02-03 DIAGNOSIS — M19071 Primary osteoarthritis, right ankle and foot: Secondary | ICD-10-CM

## 2022-02-03 DIAGNOSIS — B351 Tinea unguium: Secondary | ICD-10-CM

## 2022-02-04 NOTE — Progress Notes (Signed)
° °  Subjective:    Patient ID: Lisa Jackson, female    DOB: 09/07/1956, 66 y.o.   MRN: [id number]  HPI chief complaint: Right great toe pain  Lisa Jackson presents today with right great toe pain that has been present for about 2 weeks.  No injury that she can recall.  She has returned to some limited running.  She localizes her pain to the first MTP joint.  It is only mildly uncomfortable.  She is also asking about some discoloration of her toenails.  Interim medical history reviewed Medications reviewed Allergies reviewed  Review of Systems As above    Objective:   Physical Exam  Well-developed, well-nourished.  No acute distress  Examination of her feet in the standing position shows metatarsus abductus bilaterally.  She has splaying of the first and second toes bilaterally consistent with transverse arch collapse.  Pronation with walking.  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  Slight tenderness to palpation over the dorsum of the joint.  No swelling.  There is some discoloration of both great toenails consistent with onychomycosis.  Good pulses.      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  I recommended that she try some topical Voltaren and I explained to her that stiff soled shoes will be more comfortable than regular tennis shoes.  She has custom orthotics which we have created in the past.  We will add a small metatarsal pad to the right.  If she finds it uncomfortable then she can remove it.  I mention the idea of a cortisone injection if her pain worsens but she is not interested in this as treatment.  For her onychomycoses I recommend that she discuss treatment with her PCP.  Follow-up with me as needed.  This note was dictated using Dragon naturally speaking software and may  contain errors in syntax, spelling, or content which have not been identified prior to signing this note.

## 2022-03-05 DIAGNOSIS — G4733 Obstructive sleep apnea (adult) (pediatric): Secondary | ICD-10-CM | POA: Diagnosis not present

## 2022-03-16 DIAGNOSIS — Z8616 Personal history of COVID-19: Secondary | ICD-10-CM | POA: Diagnosis not present

## 2022-03-16 DIAGNOSIS — B349 Viral infection, unspecified: Secondary | ICD-10-CM | POA: Diagnosis not present

## 2022-03-16 DIAGNOSIS — R519 Headache, unspecified: Secondary | ICD-10-CM | POA: Diagnosis not present

## 2022-03-16 DIAGNOSIS — Z20822 Contact with and (suspected) exposure to covid-19: Secondary | ICD-10-CM | POA: Diagnosis not present

## 2022-03-16 DIAGNOSIS — R0981 Nasal congestion: Secondary | ICD-10-CM | POA: Diagnosis not present

## 2022-03-16 DIAGNOSIS — R059 Cough, unspecified: Secondary | ICD-10-CM | POA: Diagnosis not present

## 2022-03-30 ENCOUNTER — Ambulatory Visit
Admission: RE | Admit: 2022-03-30 | Discharge: 2022-03-30 | Disposition: A | Discharge status: Home / Self Care | Payer: Medicare Other | Source: Ambulatory Visit | Attending: Internal Medicine | Admitting: Internal Medicine

## 2022-03-30 ENCOUNTER — Other Ambulatory Visit: Payer: Self-pay | Admitting: Internal Medicine

## 2022-03-30 DIAGNOSIS — R0789 Other chest pain: Secondary | ICD-10-CM

## 2022-03-30 DIAGNOSIS — M810 Age-related osteoporosis without current pathological fracture: Secondary | ICD-10-CM | POA: Diagnosis not present

## 2022-03-30 DIAGNOSIS — K219 Gastro-esophageal reflux disease without esophagitis: Secondary | ICD-10-CM | POA: Diagnosis not present

## 2022-03-30 DIAGNOSIS — G259 Extrapyramidal and movement disorder, unspecified: Secondary | ICD-10-CM | POA: Diagnosis not present

## 2022-03-30 DIAGNOSIS — E7800 Pure hypercholesterolemia, unspecified: Secondary | ICD-10-CM | POA: Diagnosis not present

## 2022-03-30 DIAGNOSIS — G629 Polyneuropathy, unspecified: Secondary | ICD-10-CM | POA: Diagnosis not present

## 2022-03-30 DIAGNOSIS — F331 Major depressive disorder, recurrent, moderate: Secondary | ICD-10-CM | POA: Diagnosis not present

## 2022-03-30 DIAGNOSIS — F0390 Unspecified dementia without behavioral disturbance: Secondary | ICD-10-CM | POA: Diagnosis not present

## 2022-03-30 DIAGNOSIS — G2581 Restless legs syndrome: Secondary | ICD-10-CM | POA: Diagnosis not present

## 2022-03-30 DIAGNOSIS — I10 Essential (primary) hypertension: Secondary | ICD-10-CM | POA: Diagnosis not present

## 2022-04-09 DIAGNOSIS — G4733 Obstructive sleep apnea (adult) (pediatric): Secondary | ICD-10-CM | POA: Diagnosis not present

## 2022-04-10 DIAGNOSIS — N7689 Other specified inflammation of vagina and vulva: Secondary | ICD-10-CM | POA: Diagnosis not present

## 2022-04-10 DIAGNOSIS — N950 Postmenopausal bleeding: Secondary | ICD-10-CM | POA: Diagnosis not present

## 2022-04-14 DIAGNOSIS — N950 Postmenopausal bleeding: Secondary | ICD-10-CM | POA: Diagnosis not present

## 2022-04-21 DIAGNOSIS — L255 Unspecified contact dermatitis due to plants, except food: Secondary | ICD-10-CM | POA: Diagnosis not present

## 2022-04-29 DIAGNOSIS — F411 Generalized anxiety disorder: Secondary | ICD-10-CM | POA: Diagnosis not present

## 2022-04-29 DIAGNOSIS — F09 Unspecified mental disorder due to known physiological condition: Secondary | ICD-10-CM | POA: Diagnosis not present

## 2022-04-29 DIAGNOSIS — F330 Major depressive disorder, recurrent, mild: Secondary | ICD-10-CM | POA: Diagnosis not present

## 2022-05-13 DIAGNOSIS — R569 Unspecified convulsions: Secondary | ICD-10-CM | POA: Diagnosis not present

## 2022-05-13 DIAGNOSIS — Z8669 Personal history of other diseases of the nervous system and sense organs: Secondary | ICD-10-CM | POA: Diagnosis not present

## 2022-05-13 DIAGNOSIS — F411 Generalized anxiety disorder: Secondary | ICD-10-CM | POA: Diagnosis not present

## 2022-05-13 DIAGNOSIS — G255 Other chorea: Secondary | ICD-10-CM | POA: Diagnosis not present

## 2022-05-13 DIAGNOSIS — F32A Depression, unspecified: Secondary | ICD-10-CM | POA: Diagnosis not present

## 2022-05-13 DIAGNOSIS — R413 Other amnesia: Secondary | ICD-10-CM | POA: Diagnosis not present

## 2022-06-15 DIAGNOSIS — R569 Unspecified convulsions: Secondary | ICD-10-CM | POA: Diagnosis not present

## 2022-06-15 DIAGNOSIS — R413 Other amnesia: Secondary | ICD-10-CM | POA: Diagnosis not present

## 2022-06-29 DIAGNOSIS — Z85828 Personal history of other malignant neoplasm of skin: Secondary | ICD-10-CM | POA: Diagnosis not present

## 2022-06-29 DIAGNOSIS — L812 Freckles: Secondary | ICD-10-CM | POA: Diagnosis not present

## 2022-06-29 DIAGNOSIS — D485 Neoplasm of uncertain behavior of skin: Secondary | ICD-10-CM | POA: Diagnosis not present

## 2022-06-29 DIAGNOSIS — L570 Actinic keratosis: Secondary | ICD-10-CM | POA: Diagnosis not present

## 2022-06-29 DIAGNOSIS — C44622 Squamous cell carcinoma of skin of right upper limb, including shoulder: Secondary | ICD-10-CM | POA: Diagnosis not present

## 2022-07-16 DIAGNOSIS — G4733 Obstructive sleep apnea (adult) (pediatric): Secondary | ICD-10-CM | POA: Diagnosis not present

## 2022-07-29 DIAGNOSIS — F330 Major depressive disorder, recurrent, mild: Secondary | ICD-10-CM | POA: Diagnosis not present

## 2022-07-29 DIAGNOSIS — F09 Unspecified mental disorder due to known physiological condition: Secondary | ICD-10-CM | POA: Diagnosis not present

## 2022-07-29 DIAGNOSIS — F411 Generalized anxiety disorder: Secondary | ICD-10-CM | POA: Diagnosis not present

## 2022-07-29 DIAGNOSIS — F609 Personality disorder, unspecified: Secondary | ICD-10-CM | POA: Diagnosis not present

## 2022-08-05 DIAGNOSIS — M810 Age-related osteoporosis without current pathological fracture: Secondary | ICD-10-CM | POA: Diagnosis not present

## 2022-08-05 DIAGNOSIS — G43909 Migraine, unspecified, not intractable, without status migrainosus: Secondary | ICD-10-CM | POA: Diagnosis not present

## 2022-08-05 DIAGNOSIS — F5104 Psychophysiologic insomnia: Secondary | ICD-10-CM | POA: Diagnosis not present

## 2022-08-05 DIAGNOSIS — K219 Gastro-esophageal reflux disease without esophagitis: Secondary | ICD-10-CM | POA: Diagnosis not present

## 2022-08-05 DIAGNOSIS — F331 Major depressive disorder, recurrent, moderate: Secondary | ICD-10-CM | POA: Diagnosis not present

## 2022-08-05 DIAGNOSIS — Z1331 Encounter for screening for depression: Secondary | ICD-10-CM | POA: Diagnosis not present

## 2022-08-05 DIAGNOSIS — G4733 Obstructive sleep apnea (adult) (pediatric): Secondary | ICD-10-CM | POA: Diagnosis not present

## 2022-08-05 DIAGNOSIS — Z0000 Encounter for general adult medical examination without abnormal findings: Secondary | ICD-10-CM | POA: Diagnosis not present

## 2022-08-05 DIAGNOSIS — R7309 Other abnormal glucose: Secondary | ICD-10-CM | POA: Diagnosis not present

## 2022-08-05 DIAGNOSIS — N1831 Chronic kidney disease, stage 3a: Secondary | ICD-10-CM | POA: Diagnosis not present

## 2022-08-05 DIAGNOSIS — F0390 Unspecified dementia without behavioral disturbance: Secondary | ICD-10-CM | POA: Diagnosis not present

## 2022-08-05 DIAGNOSIS — E7800 Pure hypercholesterolemia, unspecified: Secondary | ICD-10-CM | POA: Diagnosis not present

## 2022-08-05 DIAGNOSIS — G629 Polyneuropathy, unspecified: Secondary | ICD-10-CM | POA: Diagnosis not present

## 2022-08-05 DIAGNOSIS — I10 Essential (primary) hypertension: Secondary | ICD-10-CM | POA: Diagnosis not present

## 2022-08-13 DIAGNOSIS — G4733 Obstructive sleep apnea (adult) (pediatric): Secondary | ICD-10-CM | POA: Diagnosis not present

## 2022-08-26 ENCOUNTER — Ambulatory Visit
Admission: RE | Admit: 2022-08-26 | Discharge: 2022-08-26 | Disposition: A | Discharge status: Home / Self Care | Payer: Medicare Other | Source: Ambulatory Visit | Attending: Internal Medicine | Admitting: Internal Medicine

## 2022-08-26 ENCOUNTER — Other Ambulatory Visit: Payer: Self-pay | Admitting: Internal Medicine

## 2022-08-26 DIAGNOSIS — R1031 Right lower quadrant pain: Secondary | ICD-10-CM | POA: Diagnosis not present

## 2022-08-26 DIAGNOSIS — K449 Diaphragmatic hernia without obstruction or gangrene: Secondary | ICD-10-CM | POA: Diagnosis not present

## 2022-08-26 DIAGNOSIS — K769 Liver disease, unspecified: Secondary | ICD-10-CM | POA: Diagnosis not present

## 2022-08-26 DIAGNOSIS — R197 Diarrhea, unspecified: Secondary | ICD-10-CM | POA: Diagnosis not present

## 2022-08-26 DIAGNOSIS — I700 Atherosclerosis of aorta: Secondary | ICD-10-CM | POA: Diagnosis not present

## 2022-08-26 MED ORDER — IOPAMIDOL (ISOVUE-370) INJECTION 76%
80.0000 mL | Freq: Once | INTRAVENOUS | Status: AC | PRN
  Administered 2022-08-26: 80 mL via INTRAVENOUS

## 2022-08-27 DIAGNOSIS — R197 Diarrhea, unspecified: Secondary | ICD-10-CM | POA: Diagnosis not present

## 2022-09-25 DIAGNOSIS — R0781 Pleurodynia: Secondary | ICD-10-CM | POA: Diagnosis not present

## 2022-10-06 DIAGNOSIS — R7303 Prediabetes: Secondary | ICD-10-CM | POA: Diagnosis not present

## 2022-10-07 DIAGNOSIS — Z01411 Encounter for gynecological examination (general) (routine) with abnormal findings: Secondary | ICD-10-CM | POA: Diagnosis not present

## 2022-10-07 DIAGNOSIS — M25512 Pain in left shoulder: Secondary | ICD-10-CM | POA: Diagnosis not present

## 2022-10-07 DIAGNOSIS — N952 Postmenopausal atrophic vaginitis: Secondary | ICD-10-CM | POA: Diagnosis not present

## 2022-10-13 DIAGNOSIS — I10 Essential (primary) hypertension: Secondary | ICD-10-CM | POA: Diagnosis not present

## 2022-10-13 DIAGNOSIS — R197 Diarrhea, unspecified: Secondary | ICD-10-CM | POA: Diagnosis not present

## 2022-10-13 DIAGNOSIS — Z23 Encounter for immunization: Secondary | ICD-10-CM | POA: Diagnosis not present

## 2022-10-13 DIAGNOSIS — R7303 Prediabetes: Secondary | ICD-10-CM | POA: Diagnosis not present

## 2022-10-19 DIAGNOSIS — N1831 Chronic kidney disease, stage 3a: Secondary | ICD-10-CM | POA: Diagnosis not present

## 2022-10-19 DIAGNOSIS — K219 Gastro-esophageal reflux disease without esophagitis: Secondary | ICD-10-CM | POA: Diagnosis not present

## 2022-10-19 DIAGNOSIS — R7303 Prediabetes: Secondary | ICD-10-CM | POA: Diagnosis not present

## 2022-10-19 DIAGNOSIS — G4733 Obstructive sleep apnea (adult) (pediatric): Secondary | ICD-10-CM | POA: Diagnosis not present

## 2022-10-19 DIAGNOSIS — I10 Essential (primary) hypertension: Secondary | ICD-10-CM | POA: Diagnosis not present

## 2022-10-30 DIAGNOSIS — F609 Personality disorder, unspecified: Secondary | ICD-10-CM | POA: Diagnosis not present

## 2022-10-30 DIAGNOSIS — F09 Unspecified mental disorder due to known physiological condition: Secondary | ICD-10-CM | POA: Diagnosis not present

## 2022-10-30 DIAGNOSIS — F330 Major depressive disorder, recurrent, mild: Secondary | ICD-10-CM | POA: Diagnosis not present

## 2022-10-30 DIAGNOSIS — F411 Generalized anxiety disorder: Secondary | ICD-10-CM | POA: Diagnosis not present

## 2022-11-17 DIAGNOSIS — R7303 Prediabetes: Secondary | ICD-10-CM | POA: Diagnosis not present

## 2022-11-17 DIAGNOSIS — G4733 Obstructive sleep apnea (adult) (pediatric): Secondary | ICD-10-CM | POA: Diagnosis not present

## 2022-11-17 DIAGNOSIS — N1831 Chronic kidney disease, stage 3a: Secondary | ICD-10-CM | POA: Diagnosis not present

## 2022-11-17 DIAGNOSIS — E7800 Pure hypercholesterolemia, unspecified: Secondary | ICD-10-CM | POA: Diagnosis not present

## 2022-11-17 DIAGNOSIS — I10 Essential (primary) hypertension: Secondary | ICD-10-CM | POA: Diagnosis not present

## 2022-12-01 DIAGNOSIS — S51851A Open bite of right forearm, initial encounter: Secondary | ICD-10-CM | POA: Diagnosis not present

## 2022-12-01 DIAGNOSIS — W5501XA Bitten by cat, initial encounter: Secondary | ICD-10-CM | POA: Diagnosis not present

## 2022-12-01 DIAGNOSIS — L03113 Cellulitis of right upper limb: Secondary | ICD-10-CM | POA: Diagnosis not present

## 2022-12-15 DIAGNOSIS — R569 Unspecified convulsions: Secondary | ICD-10-CM | POA: Diagnosis not present

## 2022-12-15 DIAGNOSIS — F447 Conversion disorder with mixed symptom presentation: Secondary | ICD-10-CM | POA: Diagnosis not present

## 2022-12-15 DIAGNOSIS — F411 Generalized anxiety disorder: Secondary | ICD-10-CM | POA: Diagnosis not present

## 2022-12-15 DIAGNOSIS — R4189 Other symptoms and signs involving cognitive functions and awareness: Secondary | ICD-10-CM | POA: Diagnosis not present

## 2022-12-15 DIAGNOSIS — F32A Depression, unspecified: Secondary | ICD-10-CM | POA: Diagnosis not present

## 2022-12-30 DIAGNOSIS — L812 Freckles: Secondary | ICD-10-CM | POA: Diagnosis not present

## 2022-12-30 DIAGNOSIS — L821 Other seborrheic keratosis: Secondary | ICD-10-CM | POA: Diagnosis not present

## 2022-12-30 DIAGNOSIS — Z85828 Personal history of other malignant neoplasm of skin: Secondary | ICD-10-CM | POA: Diagnosis not present

## 2022-12-30 DIAGNOSIS — L570 Actinic keratosis: Secondary | ICD-10-CM | POA: Diagnosis not present

## 2022-12-30 DIAGNOSIS — L853 Xerosis cutis: Secondary | ICD-10-CM | POA: Diagnosis not present

## 2022-12-30 DIAGNOSIS — L720 Epidermal cyst: Secondary | ICD-10-CM | POA: Diagnosis not present

## 2023-01-04 DIAGNOSIS — M722 Plantar fascial fibromatosis: Secondary | ICD-10-CM | POA: Diagnosis not present

## 2023-01-08 DIAGNOSIS — N3281 Overactive bladder: Secondary | ICD-10-CM | POA: Diagnosis not present

## 2023-01-08 DIAGNOSIS — N3941 Urge incontinence: Secondary | ICD-10-CM | POA: Diagnosis not present

## 2023-01-22 DIAGNOSIS — R569 Unspecified convulsions: Secondary | ICD-10-CM | POA: Diagnosis not present

## 2023-01-23 DIAGNOSIS — R569 Unspecified convulsions: Secondary | ICD-10-CM | POA: Diagnosis not present

## 2023-01-24 DIAGNOSIS — R569 Unspecified convulsions: Secondary | ICD-10-CM | POA: Diagnosis not present

## 2023-01-27 DIAGNOSIS — F09 Unspecified mental disorder due to known physiological condition: Secondary | ICD-10-CM | POA: Diagnosis not present

## 2023-01-27 DIAGNOSIS — F609 Personality disorder, unspecified: Secondary | ICD-10-CM | POA: Diagnosis not present

## 2023-01-27 DIAGNOSIS — F330 Major depressive disorder, recurrent, mild: Secondary | ICD-10-CM | POA: Diagnosis not present

## 2023-01-27 DIAGNOSIS — F411 Generalized anxiety disorder: Secondary | ICD-10-CM | POA: Diagnosis not present

## 2023-01-29 DIAGNOSIS — R194 Change in bowel habit: Secondary | ICD-10-CM | POA: Diagnosis not present

## 2023-01-29 DIAGNOSIS — R1033 Periumbilical pain: Secondary | ICD-10-CM | POA: Diagnosis not present

## 2023-01-29 DIAGNOSIS — D508 Other iron deficiency anemias: Secondary | ICD-10-CM | POA: Diagnosis not present

## 2023-02-02 DIAGNOSIS — R7303 Prediabetes: Secondary | ICD-10-CM | POA: Diagnosis not present

## 2023-02-02 DIAGNOSIS — N1831 Chronic kidney disease, stage 3a: Secondary | ICD-10-CM | POA: Diagnosis not present

## 2023-02-05 DIAGNOSIS — F330 Major depressive disorder, recurrent, mild: Secondary | ICD-10-CM | POA: Diagnosis not present

## 2023-02-08 DIAGNOSIS — D508 Other iron deficiency anemias: Secondary | ICD-10-CM | POA: Diagnosis not present

## 2023-02-23 DIAGNOSIS — F331 Major depressive disorder, recurrent, moderate: Secondary | ICD-10-CM | POA: Diagnosis not present

## 2023-02-23 DIAGNOSIS — F0390 Unspecified dementia without behavioral disturbance: Secondary | ICD-10-CM | POA: Diagnosis not present

## 2023-02-23 DIAGNOSIS — M797 Fibromyalgia: Secondary | ICD-10-CM | POA: Diagnosis not present

## 2023-02-23 DIAGNOSIS — N1831 Chronic kidney disease, stage 3a: Secondary | ICD-10-CM | POA: Diagnosis not present

## 2023-02-23 DIAGNOSIS — G259 Extrapyramidal and movement disorder, unspecified: Secondary | ICD-10-CM | POA: Diagnosis not present

## 2023-02-23 DIAGNOSIS — R7303 Prediabetes: Secondary | ICD-10-CM | POA: Diagnosis not present

## 2023-02-23 DIAGNOSIS — I10 Essential (primary) hypertension: Secondary | ICD-10-CM | POA: Diagnosis not present

## 2023-02-23 DIAGNOSIS — F5104 Psychophysiologic insomnia: Secondary | ICD-10-CM | POA: Diagnosis not present

## 2023-02-23 DIAGNOSIS — D649 Anemia, unspecified: Secondary | ICD-10-CM | POA: Diagnosis not present

## 2023-02-23 DIAGNOSIS — G4733 Obstructive sleep apnea (adult) (pediatric): Secondary | ICD-10-CM | POA: Diagnosis not present

## 2023-02-26 DIAGNOSIS — R195 Other fecal abnormalities: Secondary | ICD-10-CM | POA: Diagnosis not present

## 2023-02-26 DIAGNOSIS — R194 Change in bowel habit: Secondary | ICD-10-CM | POA: Diagnosis not present

## 2023-03-02 ENCOUNTER — Ambulatory Visit (INDEPENDENT_AMBULATORY_CARE_PROVIDER_SITE_OTHER): Payer: Medicare Other | Admitting: Sports Medicine

## 2023-03-02 ENCOUNTER — Ambulatory Visit: Payer: Medicare Other | Admitting: Sports Medicine

## 2023-03-02 VITALS — BP 120/82 | Ht 61.0 in | Wt 154.0 lb

## 2023-03-02 DIAGNOSIS — M25561 Pain in right knee: Secondary | ICD-10-CM

## 2023-03-02 DIAGNOSIS — M722 Plantar fascial fibromatosis: Secondary | ICD-10-CM

## 2023-03-03 DIAGNOSIS — N1831 Chronic kidney disease, stage 3a: Secondary | ICD-10-CM | POA: Diagnosis not present

## 2023-03-03 DIAGNOSIS — G4733 Obstructive sleep apnea (adult) (pediatric): Secondary | ICD-10-CM | POA: Diagnosis not present

## 2023-03-03 DIAGNOSIS — R7303 Prediabetes: Secondary | ICD-10-CM | POA: Diagnosis not present

## 2023-03-03 DIAGNOSIS — I10 Essential (primary) hypertension: Secondary | ICD-10-CM | POA: Diagnosis not present

## 2023-03-03 NOTE — Progress Notes (Signed)
   Subjective:    Patient ID: Lisa Jackson, female    DOB: 06-Jan-1956, 67 y.o.   MRN: [id number]  HPI chief complaint: Left heel pain and right knee pain  Lisa Jackson presents today with a couple of different complaints.  Primary complaint is left heel pain that began in August after she jumped into an empty pool striking her calcaneus on the concrete.  She was seen at [facility] shortly after that and x-ray showed no evidence of fracture.  Since then she has continued to experience pain along the plantar aspect of the heel, particularly with first step in the morning and when first getting up from a seated position.  She continues to enjoy racing and running and has a race upcoming in less than 3 weeks.  Her pain has not prevented her from running.  She did get some heel cups which have been minimally helpful.  She is also complaining of right knee pain that began about a week ago.  No injury to the knee.  She does have a history of left knee arthroscopy but no prior surgeries to the right knee.  She has not noticed any swelling.  No feelings of instability.  Interval medical history reviewed Medications reviewed Allergies reviewed    Review of Systems As above    Objective:   Physical Exam  Well-developed, well-nourished.  No acute distress  Left heel: Full ankle range of motion.  There is no soft tissue swelling.  There is tenderness to palpation at the calcaneal origin of the plantar fascia.  Negative calcaneal squeeze.  Good pulses.  Right knee: Full range of motion.  No effusion.  No soft tissue swelling.  She is tender to palpation along the distal IT band over the lateral femoral condyle.  4/5 strength with resisted hip abduction.  Slight tenderness along the lateral joint line but a negative McMurray's.  No tenderness over the medial joint line.  Knee is stable to ligamentous exam.  Neurovascularly intact distally.      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  She will continue with her heel cups as well.  I did discuss the possibility of soundwave treatment if symptoms do not improve.  She is okay to continue running using pain as her guide.  In regards to the right knee IT band syndrome, we will give her some IT band stretches as well as some hip abductor strengthening exercises.  She will follow-up for ongoing or recalcitrant issues.  This note was dictated using Dragon naturally speaking software and may contain errors in syntax, spelling, or content which have not been identified prior to signing this note.

## 2023-03-04 DIAGNOSIS — K640 First degree hemorrhoids: Secondary | ICD-10-CM | POA: Diagnosis not present

## 2023-03-04 DIAGNOSIS — R195 Other fecal abnormalities: Secondary | ICD-10-CM | POA: Diagnosis not present

## 2023-03-04 DIAGNOSIS — K644 Residual hemorrhoidal skin tags: Secondary | ICD-10-CM | POA: Diagnosis not present

## 2023-03-16 DIAGNOSIS — G4733 Obstructive sleep apnea (adult) (pediatric): Secondary | ICD-10-CM | POA: Diagnosis not present

## 2023-03-23 DIAGNOSIS — D649 Anemia, unspecified: Secondary | ICD-10-CM | POA: Diagnosis not present

## 2023-04-10 DIAGNOSIS — G43909 Migraine, unspecified, not intractable, without status migrainosus: Secondary | ICD-10-CM | POA: Diagnosis not present

## 2023-04-20 DIAGNOSIS — F609 Personality disorder, unspecified: Secondary | ICD-10-CM | POA: Diagnosis not present

## 2023-04-20 DIAGNOSIS — R419 Unspecified symptoms and signs involving cognitive functions and awareness: Secondary | ICD-10-CM | POA: Diagnosis not present

## 2023-04-20 DIAGNOSIS — F411 Generalized anxiety disorder: Secondary | ICD-10-CM | POA: Diagnosis not present

## 2023-04-20 DIAGNOSIS — F330 Major depressive disorder, recurrent, mild: Secondary | ICD-10-CM | POA: Diagnosis not present

## 2023-04-20 DIAGNOSIS — F29 Unspecified psychosis not due to a substance or known physiological condition: Secondary | ICD-10-CM | POA: Diagnosis not present

## 2023-04-20 DIAGNOSIS — Z79899 Other long term (current) drug therapy: Secondary | ICD-10-CM | POA: Diagnosis not present

## 2023-05-13 DIAGNOSIS — D509 Iron deficiency anemia, unspecified: Secondary | ICD-10-CM | POA: Diagnosis not present

## 2023-05-13 DIAGNOSIS — R4189 Other symptoms and signs involving cognitive functions and awareness: Secondary | ICD-10-CM | POA: Diagnosis not present

## 2023-05-13 DIAGNOSIS — F411 Generalized anxiety disorder: Secondary | ICD-10-CM | POA: Diagnosis not present

## 2023-05-13 DIAGNOSIS — G4733 Obstructive sleep apnea (adult) (pediatric): Secondary | ICD-10-CM | POA: Diagnosis not present

## 2023-05-13 DIAGNOSIS — F32A Depression, unspecified: Secondary | ICD-10-CM | POA: Diagnosis not present

## 2023-05-13 DIAGNOSIS — F447 Conversion disorder with mixed symptom presentation: Secondary | ICD-10-CM | POA: Diagnosis not present

## 2023-05-21 DIAGNOSIS — K5900 Constipation, unspecified: Secondary | ICD-10-CM | POA: Diagnosis not present

## 2023-05-24 DIAGNOSIS — I10 Essential (primary) hypertension: Secondary | ICD-10-CM | POA: Diagnosis not present

## 2023-05-24 DIAGNOSIS — R7303 Prediabetes: Secondary | ICD-10-CM | POA: Diagnosis not present

## 2023-05-24 DIAGNOSIS — N1831 Chronic kidney disease, stage 3a: Secondary | ICD-10-CM | POA: Diagnosis not present

## 2023-05-24 DIAGNOSIS — D649 Anemia, unspecified: Secondary | ICD-10-CM | POA: Diagnosis not present

## 2023-06-11 DIAGNOSIS — R5383 Other fatigue: Secondary | ICD-10-CM | POA: Diagnosis not present

## 2023-06-11 DIAGNOSIS — D649 Anemia, unspecified: Secondary | ICD-10-CM | POA: Diagnosis not present

## 2023-06-16 ENCOUNTER — Other Ambulatory Visit: Payer: Self-pay

## 2023-06-16 DIAGNOSIS — D509 Iron deficiency anemia, unspecified: Secondary | ICD-10-CM | POA: Insufficient documentation

## 2023-06-16 DIAGNOSIS — D500 Iron deficiency anemia secondary to blood loss (chronic): Secondary | ICD-10-CM

## 2023-06-30 DIAGNOSIS — Z85828 Personal history of other malignant neoplasm of skin: Secondary | ICD-10-CM | POA: Diagnosis not present

## 2023-06-30 DIAGNOSIS — L821 Other seborrheic keratosis: Secondary | ICD-10-CM | POA: Diagnosis not present

## 2023-06-30 DIAGNOSIS — L812 Freckles: Secondary | ICD-10-CM | POA: Diagnosis not present

## 2023-07-05 ENCOUNTER — Other Ambulatory Visit: Payer: Self-pay

## 2023-07-07 ENCOUNTER — Telehealth: Payer: Self-pay | Admitting: Pharmacy Technician

## 2023-07-07 NOTE — Telephone Encounter (Signed)
Auth Submission: NO AUTH NEEDED Site of care: Site of care: [facility] Payer: medicare a/b & supp Medication & CPT/J Code(s) submitted: Feraheme (ferumoxytol) [id number] Route of submission (phone, fax, portal):  Phone # Fax # Auth type: Buy/Bill Units/visits requested: 2 Reference number:  Approval from: 07/07/23 to 11/07/23

## 2023-07-08 DIAGNOSIS — R4189 Other symptoms and signs involving cognitive functions and awareness: Secondary | ICD-10-CM | POA: Diagnosis not present

## 2023-07-12 ENCOUNTER — Ambulatory Visit: Payer: Medicare Other

## 2023-07-12 VITALS — BP 94/65 | HR 64 | Temp 97.4°F | Resp 16 | Ht 61.0 in | Wt 157.0 lb

## 2023-07-12 DIAGNOSIS — D509 Iron deficiency anemia, unspecified: Secondary | ICD-10-CM | POA: Diagnosis not present

## 2023-07-12 DIAGNOSIS — D500 Iron deficiency anemia secondary to blood loss (chronic): Secondary | ICD-10-CM

## 2023-07-12 MED ORDER — SODIUM CHLORIDE 0.9 % IV SOLN
510.0000 mg | Freq: Once | INTRAVENOUS | Status: AC
  Administered 2023-07-12: 510 mg via INTRAVENOUS
  Filled 2023-07-12: qty 17

## 2023-07-12 MED ORDER — DIPHENHYDRAMINE HCL 25 MG PO CAPS
25.0000 mg | ORAL_CAPSULE | Freq: Once | ORAL | Status: AC
  Administered 2023-07-12: 25 mg via ORAL
  Filled 2023-07-12: qty 1

## 2023-07-12 MED ORDER — ACETAMINOPHEN 325 MG PO TABS
650.0000 mg | ORAL_TABLET | Freq: Once | ORAL | Status: AC
  Administered 2023-07-12: 650 mg via ORAL
  Filled 2023-07-12: qty 2

## 2023-07-12 NOTE — Progress Notes (Signed)
Diagnosis: Iron Deficiency Anemia  Provider:  Chilton Greathouse MD  Procedure: IV Infusion  IV Type: Peripheral, IV Location: R Antecubital  Feraheme (Ferumoxytol), Dose: 510 mg  Infusion Start Time: 0935  Infusion Stop Time: 0954  Post Infusion IV Care: Observation period completed and Peripheral IV Discontinued  Discharge: Condition: Good, Destination: Home . AVS Declined  Performed by:  Loney Hering, LPN

## 2023-07-15 DIAGNOSIS — F29 Unspecified psychosis not due to a substance or known physiological condition: Secondary | ICD-10-CM | POA: Diagnosis not present

## 2023-07-15 DIAGNOSIS — Z79899 Other long term (current) drug therapy: Secondary | ICD-10-CM | POA: Diagnosis not present

## 2023-07-15 DIAGNOSIS — F609 Personality disorder, unspecified: Secondary | ICD-10-CM | POA: Diagnosis not present

## 2023-07-15 DIAGNOSIS — R419 Unspecified symptoms and signs involving cognitive functions and awareness: Secondary | ICD-10-CM | POA: Diagnosis not present

## 2023-07-15 DIAGNOSIS — F411 Generalized anxiety disorder: Secondary | ICD-10-CM | POA: Diagnosis not present

## 2023-07-15 DIAGNOSIS — F330 Major depressive disorder, recurrent, mild: Secondary | ICD-10-CM | POA: Diagnosis not present

## 2023-07-22 ENCOUNTER — Ambulatory Visit (INDEPENDENT_AMBULATORY_CARE_PROVIDER_SITE_OTHER): Payer: Medicare Other

## 2023-07-22 VITALS — BP 104/63 | HR 74 | Temp 96.0°F | Resp 16 | Ht 64.0 in | Wt 156.2 lb

## 2023-07-22 DIAGNOSIS — D500 Iron deficiency anemia secondary to blood loss (chronic): Secondary | ICD-10-CM

## 2023-07-22 DIAGNOSIS — D509 Iron deficiency anemia, unspecified: Secondary | ICD-10-CM

## 2023-07-22 MED ORDER — SODIUM CHLORIDE 0.9 % IV SOLN
510.0000 mg | Freq: Once | INTRAVENOUS | Status: AC
  Administered 2023-07-22: 510 mg via INTRAVENOUS
  Filled 2023-07-22: qty 17

## 2023-07-22 MED ORDER — DIPHENHYDRAMINE HCL 25 MG PO CAPS
25.0000 mg | ORAL_CAPSULE | Freq: Once | ORAL | Status: AC
  Administered 2023-07-22: 25 mg via ORAL
  Filled 2023-07-22: qty 1

## 2023-07-22 MED ORDER — ACETAMINOPHEN 325 MG PO TABS
650.0000 mg | ORAL_TABLET | Freq: Once | ORAL | Status: AC
  Administered 2023-07-22: 650 mg via ORAL
  Filled 2023-07-22: qty 2

## 2023-07-22 NOTE — Progress Notes (Signed)
Diagnosis: Iron Deficiency Anemia  Provider:  Chilton Greathouse MD  Procedure: IV Infusion  IV Type: Peripheral, IV Location: L Antecubital  Feraheme (Ferumoxytol), Dose: 510 mg  Infusion Start Time: 0938  Infusion Stop Time: 0956  Post Infusion IV Care: Patient declined observation and Peripheral IV Discontinued  Discharge: Condition: Good, Destination: Home . AVS Declined  Performed by:  Adriana Mccallum, RN

## 2023-08-09 ENCOUNTER — Other Ambulatory Visit: Payer: Self-pay | Admitting: Internal Medicine

## 2023-08-09 DIAGNOSIS — G259 Extrapyramidal and movement disorder, unspecified: Secondary | ICD-10-CM | POA: Diagnosis not present

## 2023-08-09 DIAGNOSIS — F0390 Unspecified dementia without behavioral disturbance: Secondary | ICD-10-CM | POA: Diagnosis not present

## 2023-08-09 DIAGNOSIS — E7800 Pure hypercholesterolemia, unspecified: Secondary | ICD-10-CM | POA: Diagnosis not present

## 2023-08-09 DIAGNOSIS — R7303 Prediabetes: Secondary | ICD-10-CM | POA: Diagnosis not present

## 2023-08-09 DIAGNOSIS — Z1331 Encounter for screening for depression: Secondary | ICD-10-CM | POA: Diagnosis not present

## 2023-08-09 DIAGNOSIS — N1831 Chronic kidney disease, stage 3a: Secondary | ICD-10-CM | POA: Diagnosis not present

## 2023-08-09 DIAGNOSIS — Z1231 Encounter for screening mammogram for malignant neoplasm of breast: Secondary | ICD-10-CM

## 2023-08-09 DIAGNOSIS — G629 Polyneuropathy, unspecified: Secondary | ICD-10-CM | POA: Diagnosis not present

## 2023-08-09 DIAGNOSIS — G4733 Obstructive sleep apnea (adult) (pediatric): Secondary | ICD-10-CM | POA: Diagnosis not present

## 2023-08-09 DIAGNOSIS — M810 Age-related osteoporosis without current pathological fracture: Secondary | ICD-10-CM | POA: Diagnosis not present

## 2023-08-09 DIAGNOSIS — F331 Major depressive disorder, recurrent, moderate: Secondary | ICD-10-CM | POA: Diagnosis not present

## 2023-08-09 DIAGNOSIS — I10 Essential (primary) hypertension: Secondary | ICD-10-CM | POA: Diagnosis not present

## 2023-08-09 DIAGNOSIS — F5104 Psychophysiologic insomnia: Secondary | ICD-10-CM | POA: Diagnosis not present

## 2023-08-09 DIAGNOSIS — Z0000 Encounter for general adult medical examination without abnormal findings: Secondary | ICD-10-CM | POA: Diagnosis not present

## 2023-08-09 DIAGNOSIS — D649 Anemia, unspecified: Secondary | ICD-10-CM | POA: Diagnosis not present

## 2023-08-12 DIAGNOSIS — F0390 Unspecified dementia without behavioral disturbance: Secondary | ICD-10-CM | POA: Diagnosis not present

## 2023-08-12 DIAGNOSIS — K219 Gastro-esophageal reflux disease without esophagitis: Secondary | ICD-10-CM | POA: Diagnosis not present

## 2023-08-12 DIAGNOSIS — E7800 Pure hypercholesterolemia, unspecified: Secondary | ICD-10-CM | POA: Diagnosis not present

## 2023-08-12 DIAGNOSIS — I10 Essential (primary) hypertension: Secondary | ICD-10-CM | POA: Diagnosis not present

## 2023-08-12 DIAGNOSIS — N1831 Chronic kidney disease, stage 3a: Secondary | ICD-10-CM | POA: Diagnosis not present

## 2023-09-29 ENCOUNTER — Ambulatory Visit: Payer: Medicare Other

## 2023-09-29 DIAGNOSIS — Z1231 Encounter for screening mammogram for malignant neoplasm of breast: Secondary | ICD-10-CM

## 2023-10-05 DIAGNOSIS — I10 Essential (primary) hypertension: Secondary | ICD-10-CM | POA: Diagnosis not present

## 2023-10-05 DIAGNOSIS — D649 Anemia, unspecified: Secondary | ICD-10-CM | POA: Diagnosis not present

## 2023-10-05 DIAGNOSIS — E7800 Pure hypercholesterolemia, unspecified: Secondary | ICD-10-CM | POA: Diagnosis not present

## 2023-10-05 DIAGNOSIS — K219 Gastro-esophageal reflux disease without esophagitis: Secondary | ICD-10-CM | POA: Diagnosis not present

## 2023-10-05 DIAGNOSIS — R7303 Prediabetes: Secondary | ICD-10-CM | POA: Diagnosis not present

## 2023-10-05 DIAGNOSIS — N1831 Chronic kidney disease, stage 3a: Secondary | ICD-10-CM | POA: Diagnosis not present

## 2023-10-14 DIAGNOSIS — R419 Unspecified symptoms and signs involving cognitive functions and awareness: Secondary | ICD-10-CM | POA: Diagnosis not present

## 2023-10-14 DIAGNOSIS — Z79899 Other long term (current) drug therapy: Secondary | ICD-10-CM | POA: Diagnosis not present

## 2023-10-14 DIAGNOSIS — F330 Major depressive disorder, recurrent, mild: Secondary | ICD-10-CM | POA: Diagnosis not present

## 2023-10-14 DIAGNOSIS — F609 Personality disorder, unspecified: Secondary | ICD-10-CM | POA: Diagnosis not present

## 2023-10-14 DIAGNOSIS — F411 Generalized anxiety disorder: Secondary | ICD-10-CM | POA: Diagnosis not present

## 2023-10-14 DIAGNOSIS — F29 Unspecified psychosis not due to a substance or known physiological condition: Secondary | ICD-10-CM | POA: Diagnosis not present

## 2023-10-18 DIAGNOSIS — R441 Visual hallucinations: Secondary | ICD-10-CM | POA: Diagnosis not present

## 2023-10-18 DIAGNOSIS — F603 Borderline personality disorder: Secondary | ICD-10-CM | POA: Diagnosis not present

## 2023-10-18 DIAGNOSIS — F0390 Unspecified dementia without behavioral disturbance: Secondary | ICD-10-CM | POA: Diagnosis not present

## 2023-10-18 DIAGNOSIS — F32A Depression, unspecified: Secondary | ICD-10-CM | POA: Diagnosis not present

## 2023-10-18 DIAGNOSIS — R2689 Other abnormalities of gait and mobility: Secondary | ICD-10-CM | POA: Diagnosis not present

## 2023-10-18 DIAGNOSIS — R269 Unspecified abnormalities of gait and mobility: Secondary | ICD-10-CM | POA: Diagnosis not present

## 2023-10-18 DIAGNOSIS — F411 Generalized anxiety disorder: Secondary | ICD-10-CM | POA: Diagnosis not present

## 2023-10-28 DIAGNOSIS — G4733 Obstructive sleep apnea (adult) (pediatric): Secondary | ICD-10-CM | POA: Diagnosis not present

## 2023-11-02 DIAGNOSIS — M6281 Muscle weakness (generalized): Secondary | ICD-10-CM | POA: Diagnosis not present

## 2023-11-02 DIAGNOSIS — R2681 Unsteadiness on feet: Secondary | ICD-10-CM | POA: Diagnosis not present

## 2023-11-02 DIAGNOSIS — R32 Unspecified urinary incontinence: Secondary | ICD-10-CM | POA: Diagnosis not present

## 2023-11-05 DIAGNOSIS — N3941 Urge incontinence: Secondary | ICD-10-CM | POA: Diagnosis not present

## 2023-11-09 DIAGNOSIS — J309 Allergic rhinitis, unspecified: Secondary | ICD-10-CM | POA: Diagnosis not present

## 2023-11-09 DIAGNOSIS — M810 Age-related osteoporosis without current pathological fracture: Secondary | ICD-10-CM | POA: Diagnosis not present

## 2023-11-09 DIAGNOSIS — F331 Major depressive disorder, recurrent, moderate: Secondary | ICD-10-CM | POA: Diagnosis not present

## 2023-11-09 DIAGNOSIS — N1831 Chronic kidney disease, stage 3a: Secondary | ICD-10-CM | POA: Diagnosis not present

## 2023-11-09 DIAGNOSIS — I700 Atherosclerosis of aorta: Secondary | ICD-10-CM | POA: Diagnosis not present

## 2023-11-09 DIAGNOSIS — Z23 Encounter for immunization: Secondary | ICD-10-CM | POA: Diagnosis not present

## 2023-11-09 DIAGNOSIS — G4733 Obstructive sleep apnea (adult) (pediatric): Secondary | ICD-10-CM | POA: Diagnosis not present

## 2023-11-09 DIAGNOSIS — F0390 Unspecified dementia without behavioral disturbance: Secondary | ICD-10-CM | POA: Diagnosis not present

## 2023-11-09 DIAGNOSIS — I10 Essential (primary) hypertension: Secondary | ICD-10-CM | POA: Diagnosis not present

## 2023-11-09 DIAGNOSIS — K219 Gastro-esophageal reflux disease without esophagitis: Secondary | ICD-10-CM | POA: Diagnosis not present

## 2023-11-09 DIAGNOSIS — R7303 Prediabetes: Secondary | ICD-10-CM | POA: Diagnosis not present

## 2023-11-09 DIAGNOSIS — E7800 Pure hypercholesterolemia, unspecified: Secondary | ICD-10-CM | POA: Diagnosis not present

## 2023-11-10 DIAGNOSIS — R32 Unspecified urinary incontinence: Secondary | ICD-10-CM | POA: Diagnosis not present

## 2023-11-10 DIAGNOSIS — R2681 Unsteadiness on feet: Secondary | ICD-10-CM | POA: Diagnosis not present

## 2023-11-10 DIAGNOSIS — M6281 Muscle weakness (generalized): Secondary | ICD-10-CM | POA: Diagnosis not present

## 2023-11-16 DIAGNOSIS — R441 Visual hallucinations: Secondary | ICD-10-CM | POA: Diagnosis not present

## 2023-11-16 DIAGNOSIS — F0390 Unspecified dementia without behavioral disturbance: Secondary | ICD-10-CM | POA: Diagnosis not present

## 2023-11-16 DIAGNOSIS — R2689 Other abnormalities of gait and mobility: Secondary | ICD-10-CM | POA: Diagnosis not present

## 2023-11-16 DIAGNOSIS — R269 Unspecified abnormalities of gait and mobility: Secondary | ICD-10-CM | POA: Diagnosis not present

## 2023-11-17 DIAGNOSIS — R32 Unspecified urinary incontinence: Secondary | ICD-10-CM | POA: Diagnosis not present

## 2023-11-17 DIAGNOSIS — M6281 Muscle weakness (generalized): Secondary | ICD-10-CM | POA: Diagnosis not present

## 2023-11-17 DIAGNOSIS — R2681 Unsteadiness on feet: Secondary | ICD-10-CM | POA: Diagnosis not present

## 2023-11-22 DIAGNOSIS — R2681 Unsteadiness on feet: Secondary | ICD-10-CM | POA: Diagnosis not present

## 2023-11-22 DIAGNOSIS — R32 Unspecified urinary incontinence: Secondary | ICD-10-CM | POA: Diagnosis not present

## 2023-11-22 DIAGNOSIS — M6281 Muscle weakness (generalized): Secondary | ICD-10-CM | POA: Diagnosis not present

## 2023-11-23 DIAGNOSIS — D649 Anemia, unspecified: Secondary | ICD-10-CM | POA: Diagnosis not present

## 2023-11-23 DIAGNOSIS — I10 Essential (primary) hypertension: Secondary | ICD-10-CM | POA: Diagnosis not present

## 2023-11-23 DIAGNOSIS — N1831 Chronic kidney disease, stage 3a: Secondary | ICD-10-CM | POA: Diagnosis not present

## 2023-11-23 DIAGNOSIS — K5900 Constipation, unspecified: Secondary | ICD-10-CM | POA: Diagnosis not present

## 2023-11-23 DIAGNOSIS — R7303 Prediabetes: Secondary | ICD-10-CM | POA: Diagnosis not present

## 2023-11-23 DIAGNOSIS — E7800 Pure hypercholesterolemia, unspecified: Secondary | ICD-10-CM | POA: Diagnosis not present

## 2023-11-25 DIAGNOSIS — M6281 Muscle weakness (generalized): Secondary | ICD-10-CM | POA: Diagnosis not present

## 2023-11-25 DIAGNOSIS — R2681 Unsteadiness on feet: Secondary | ICD-10-CM | POA: Diagnosis not present

## 2023-11-25 DIAGNOSIS — R32 Unspecified urinary incontinence: Secondary | ICD-10-CM | POA: Diagnosis not present

## 2023-11-30 DIAGNOSIS — M6281 Muscle weakness (generalized): Secondary | ICD-10-CM | POA: Diagnosis not present

## 2023-11-30 DIAGNOSIS — R32 Unspecified urinary incontinence: Secondary | ICD-10-CM | POA: Diagnosis not present

## 2023-11-30 DIAGNOSIS — R2681 Unsteadiness on feet: Secondary | ICD-10-CM | POA: Diagnosis not present

## 2023-12-02 DIAGNOSIS — M6281 Muscle weakness (generalized): Secondary | ICD-10-CM | POA: Diagnosis not present

## 2023-12-02 DIAGNOSIS — R2681 Unsteadiness on feet: Secondary | ICD-10-CM | POA: Diagnosis not present

## 2023-12-02 DIAGNOSIS — R32 Unspecified urinary incontinence: Secondary | ICD-10-CM | POA: Diagnosis not present

## 2023-12-06 DIAGNOSIS — M5442 Lumbago with sciatica, left side: Secondary | ICD-10-CM | POA: Diagnosis not present

## 2023-12-09 DIAGNOSIS — R2681 Unsteadiness on feet: Secondary | ICD-10-CM | POA: Diagnosis not present

## 2023-12-09 DIAGNOSIS — R32 Unspecified urinary incontinence: Secondary | ICD-10-CM | POA: Diagnosis not present

## 2023-12-09 DIAGNOSIS — M6281 Muscle weakness (generalized): Secondary | ICD-10-CM | POA: Diagnosis not present

## 2023-12-10 DIAGNOSIS — R269 Unspecified abnormalities of gait and mobility: Secondary | ICD-10-CM | POA: Diagnosis not present

## 2023-12-10 DIAGNOSIS — F0390 Unspecified dementia without behavioral disturbance: Secondary | ICD-10-CM | POA: Diagnosis not present

## 2023-12-13 DIAGNOSIS — M25552 Pain in left hip: Secondary | ICD-10-CM | POA: Diagnosis not present

## 2023-12-13 DIAGNOSIS — R82998 Other abnormal findings in urine: Secondary | ICD-10-CM | POA: Diagnosis not present

## 2023-12-13 DIAGNOSIS — N3000 Acute cystitis without hematuria: Secondary | ICD-10-CM | POA: Diagnosis not present

## 2023-12-13 DIAGNOSIS — M6281 Muscle weakness (generalized): Secondary | ICD-10-CM | POA: Diagnosis not present

## 2023-12-13 DIAGNOSIS — R2681 Unsteadiness on feet: Secondary | ICD-10-CM | POA: Diagnosis not present

## 2023-12-13 DIAGNOSIS — R32 Unspecified urinary incontinence: Secondary | ICD-10-CM | POA: Diagnosis not present

## 2023-12-16 DIAGNOSIS — F3341 Major depressive disorder, recurrent, in partial remission: Secondary | ICD-10-CM | POA: Diagnosis not present

## 2023-12-17 DIAGNOSIS — M6281 Muscle weakness (generalized): Secondary | ICD-10-CM | POA: Diagnosis not present

## 2023-12-17 DIAGNOSIS — R32 Unspecified urinary incontinence: Secondary | ICD-10-CM | POA: Diagnosis not present

## 2023-12-17 DIAGNOSIS — R2681 Unsteadiness on feet: Secondary | ICD-10-CM | POA: Diagnosis not present

## 2023-12-20 DIAGNOSIS — M25552 Pain in left hip: Secondary | ICD-10-CM | POA: Diagnosis not present

## 2023-12-20 DIAGNOSIS — M545 Low back pain, unspecified: Secondary | ICD-10-CM | POA: Diagnosis not present

## 2023-12-24 DIAGNOSIS — M47816 Spondylosis without myelopathy or radiculopathy, lumbar region: Secondary | ICD-10-CM | POA: Diagnosis not present

## 2023-12-24 DIAGNOSIS — M48061 Spinal stenosis, lumbar region without neurogenic claudication: Secondary | ICD-10-CM | POA: Diagnosis not present

## 2023-12-28 DIAGNOSIS — M6281 Muscle weakness (generalized): Secondary | ICD-10-CM | POA: Diagnosis not present

## 2023-12-28 DIAGNOSIS — R32 Unspecified urinary incontinence: Secondary | ICD-10-CM | POA: Diagnosis not present

## 2023-12-28 DIAGNOSIS — R2681 Unsteadiness on feet: Secondary | ICD-10-CM | POA: Diagnosis not present

## 2023-12-30 DIAGNOSIS — R2681 Unsteadiness on feet: Secondary | ICD-10-CM | POA: Diagnosis not present

## 2023-12-30 DIAGNOSIS — R32 Unspecified urinary incontinence: Secondary | ICD-10-CM | POA: Diagnosis not present

## 2023-12-30 DIAGNOSIS — M6281 Muscle weakness (generalized): Secondary | ICD-10-CM | POA: Diagnosis not present

## 2024-01-03 DIAGNOSIS — R32 Unspecified urinary incontinence: Secondary | ICD-10-CM | POA: Diagnosis not present

## 2024-01-03 DIAGNOSIS — M6281 Muscle weakness (generalized): Secondary | ICD-10-CM | POA: Diagnosis not present

## 2024-01-03 DIAGNOSIS — R2681 Unsteadiness on feet: Secondary | ICD-10-CM | POA: Diagnosis not present

## 2024-01-05 DIAGNOSIS — Z85828 Personal history of other malignant neoplasm of skin: Secondary | ICD-10-CM | POA: Diagnosis not present

## 2024-01-05 DIAGNOSIS — L570 Actinic keratosis: Secondary | ICD-10-CM | POA: Diagnosis not present

## 2024-01-05 DIAGNOSIS — L821 Other seborrheic keratosis: Secondary | ICD-10-CM | POA: Diagnosis not present

## 2024-01-05 DIAGNOSIS — L812 Freckles: Secondary | ICD-10-CM | POA: Diagnosis not present

## 2024-01-06 DIAGNOSIS — R2681 Unsteadiness on feet: Secondary | ICD-10-CM | POA: Diagnosis not present

## 2024-01-06 DIAGNOSIS — R32 Unspecified urinary incontinence: Secondary | ICD-10-CM | POA: Diagnosis not present

## 2024-01-06 DIAGNOSIS — M6281 Muscle weakness (generalized): Secondary | ICD-10-CM | POA: Diagnosis not present

## 2024-01-17 DIAGNOSIS — R2681 Unsteadiness on feet: Secondary | ICD-10-CM | POA: Diagnosis not present

## 2024-01-17 DIAGNOSIS — R32 Unspecified urinary incontinence: Secondary | ICD-10-CM | POA: Diagnosis not present

## 2024-01-17 DIAGNOSIS — M6281 Muscle weakness (generalized): Secondary | ICD-10-CM | POA: Diagnosis not present

## 2024-01-19 DIAGNOSIS — F330 Major depressive disorder, recurrent, mild: Secondary | ICD-10-CM | POA: Diagnosis not present

## 2024-01-19 DIAGNOSIS — R419 Unspecified symptoms and signs involving cognitive functions and awareness: Secondary | ICD-10-CM | POA: Diagnosis not present

## 2024-01-19 DIAGNOSIS — F411 Generalized anxiety disorder: Secondary | ICD-10-CM | POA: Diagnosis not present

## 2024-01-19 DIAGNOSIS — Z79899 Other long term (current) drug therapy: Secondary | ICD-10-CM | POA: Diagnosis not present

## 2024-01-19 DIAGNOSIS — F29 Unspecified psychosis not due to a substance or known physiological condition: Secondary | ICD-10-CM | POA: Diagnosis not present

## 2024-01-19 DIAGNOSIS — F609 Personality disorder, unspecified: Secondary | ICD-10-CM | POA: Diagnosis not present

## 2024-01-20 DIAGNOSIS — R32 Unspecified urinary incontinence: Secondary | ICD-10-CM | POA: Diagnosis not present

## 2024-01-20 DIAGNOSIS — R2681 Unsteadiness on feet: Secondary | ICD-10-CM | POA: Diagnosis not present

## 2024-01-20 DIAGNOSIS — M6281 Muscle weakness (generalized): Secondary | ICD-10-CM | POA: Diagnosis not present

## 2024-01-21 DIAGNOSIS — R2689 Other abnormalities of gait and mobility: Secondary | ICD-10-CM | POA: Diagnosis not present

## 2024-01-21 DIAGNOSIS — M545 Low back pain, unspecified: Secondary | ICD-10-CM | POA: Diagnosis not present

## 2024-01-24 DIAGNOSIS — R32 Unspecified urinary incontinence: Secondary | ICD-10-CM | POA: Diagnosis not present

## 2024-01-24 DIAGNOSIS — M6281 Muscle weakness (generalized): Secondary | ICD-10-CM | POA: Diagnosis not present

## 2024-01-24 DIAGNOSIS — R2681 Unsteadiness on feet: Secondary | ICD-10-CM | POA: Diagnosis not present

## 2024-01-27 DIAGNOSIS — M6281 Muscle weakness (generalized): Secondary | ICD-10-CM | POA: Diagnosis not present

## 2024-01-27 DIAGNOSIS — R2681 Unsteadiness on feet: Secondary | ICD-10-CM | POA: Diagnosis not present

## 2024-01-27 DIAGNOSIS — R32 Unspecified urinary incontinence: Secondary | ICD-10-CM | POA: Diagnosis not present

## 2024-02-01 DIAGNOSIS — M6281 Muscle weakness (generalized): Secondary | ICD-10-CM | POA: Diagnosis not present

## 2024-02-01 DIAGNOSIS — R2681 Unsteadiness on feet: Secondary | ICD-10-CM | POA: Diagnosis not present

## 2024-02-01 DIAGNOSIS — R32 Unspecified urinary incontinence: Secondary | ICD-10-CM | POA: Diagnosis not present

## 2024-02-03 DIAGNOSIS — M6281 Muscle weakness (generalized): Secondary | ICD-10-CM | POA: Diagnosis not present

## 2024-02-03 DIAGNOSIS — R2681 Unsteadiness on feet: Secondary | ICD-10-CM | POA: Diagnosis not present

## 2024-02-03 DIAGNOSIS — R32 Unspecified urinary incontinence: Secondary | ICD-10-CM | POA: Diagnosis not present

## 2024-02-04 DIAGNOSIS — M533 Sacrococcygeal disorders, not elsewhere classified: Secondary | ICD-10-CM | POA: Diagnosis not present

## 2024-02-04 DIAGNOSIS — M5136 Other intervertebral disc degeneration, lumbar region with discogenic back pain only: Secondary | ICD-10-CM | POA: Diagnosis not present

## 2024-02-04 DIAGNOSIS — M51379 Other intervertebral disc degeneration, lumbosacral region without mention of lumbar back pain or lower extremity pain: Secondary | ICD-10-CM | POA: Diagnosis not present

## 2024-02-04 DIAGNOSIS — M4317 Spondylolisthesis, lumbosacral region: Secondary | ICD-10-CM | POA: Diagnosis not present

## 2024-02-04 DIAGNOSIS — M47816 Spondylosis without myelopathy or radiculopathy, lumbar region: Secondary | ICD-10-CM | POA: Diagnosis not present

## 2024-02-04 DIAGNOSIS — M47817 Spondylosis without myelopathy or radiculopathy, lumbosacral region: Secondary | ICD-10-CM | POA: Diagnosis not present

## 2024-02-04 DIAGNOSIS — G8929 Other chronic pain: Secondary | ICD-10-CM | POA: Diagnosis not present

## 2024-02-04 DIAGNOSIS — M4807 Spinal stenosis, lumbosacral region: Secondary | ICD-10-CM | POA: Diagnosis not present

## 2024-02-04 DIAGNOSIS — M4316 Spondylolisthesis, lumbar region: Secondary | ICD-10-CM | POA: Diagnosis not present

## 2024-02-04 DIAGNOSIS — M48061 Spinal stenosis, lumbar region without neurogenic claudication: Secondary | ICD-10-CM | POA: Diagnosis not present

## 2024-02-04 DIAGNOSIS — M545 Low back pain, unspecified: Secondary | ICD-10-CM | POA: Diagnosis not present

## 2024-02-07 DIAGNOSIS — R32 Unspecified urinary incontinence: Secondary | ICD-10-CM | POA: Diagnosis not present

## 2024-02-07 DIAGNOSIS — M6281 Muscle weakness (generalized): Secondary | ICD-10-CM | POA: Diagnosis not present

## 2024-02-07 DIAGNOSIS — R2681 Unsteadiness on feet: Secondary | ICD-10-CM | POA: Diagnosis not present

## 2024-02-10 DIAGNOSIS — M25551 Pain in right hip: Secondary | ICD-10-CM | POA: Diagnosis not present

## 2024-02-10 DIAGNOSIS — M4802 Spinal stenosis, cervical region: Secondary | ICD-10-CM | POA: Diagnosis not present

## 2024-02-10 DIAGNOSIS — M47812 Spondylosis without myelopathy or radiculopathy, cervical region: Secondary | ICD-10-CM | POA: Diagnosis not present

## 2024-02-10 DIAGNOSIS — G8929 Other chronic pain: Secondary | ICD-10-CM | POA: Diagnosis not present

## 2024-02-10 DIAGNOSIS — M546 Pain in thoracic spine: Secondary | ICD-10-CM | POA: Diagnosis not present

## 2024-02-23 DIAGNOSIS — M5417 Radiculopathy, lumbosacral region: Secondary | ICD-10-CM | POA: Diagnosis not present

## 2024-02-29 DIAGNOSIS — M545 Low back pain, unspecified: Secondary | ICD-10-CM | POA: Diagnosis not present

## 2024-02-29 DIAGNOSIS — G8929 Other chronic pain: Secondary | ICD-10-CM | POA: Diagnosis not present

## 2024-02-29 DIAGNOSIS — Z7409 Other reduced mobility: Secondary | ICD-10-CM | POA: Diagnosis not present

## 2024-02-29 DIAGNOSIS — M25551 Pain in right hip: Secondary | ICD-10-CM | POA: Diagnosis not present

## 2024-02-29 DIAGNOSIS — M6281 Muscle weakness (generalized): Secondary | ICD-10-CM | POA: Diagnosis not present

## 2024-03-07 DIAGNOSIS — M25551 Pain in right hip: Secondary | ICD-10-CM | POA: Diagnosis not present

## 2024-03-07 DIAGNOSIS — G8929 Other chronic pain: Secondary | ICD-10-CM | POA: Diagnosis not present

## 2024-03-07 DIAGNOSIS — Z7409 Other reduced mobility: Secondary | ICD-10-CM | POA: Diagnosis not present

## 2024-03-07 DIAGNOSIS — M6281 Muscle weakness (generalized): Secondary | ICD-10-CM | POA: Diagnosis not present

## 2024-03-07 DIAGNOSIS — M545 Low back pain, unspecified: Secondary | ICD-10-CM | POA: Diagnosis not present

## 2024-03-16 DIAGNOSIS — M6281 Muscle weakness (generalized): Secondary | ICD-10-CM | POA: Diagnosis not present

## 2024-03-16 DIAGNOSIS — G8929 Other chronic pain: Secondary | ICD-10-CM | POA: Diagnosis not present

## 2024-03-16 DIAGNOSIS — M545 Low back pain, unspecified: Secondary | ICD-10-CM | POA: Diagnosis not present

## 2024-03-16 DIAGNOSIS — M25551 Pain in right hip: Secondary | ICD-10-CM | POA: Diagnosis not present

## 2024-03-16 DIAGNOSIS — Z7409 Other reduced mobility: Secondary | ICD-10-CM | POA: Diagnosis not present

## 2024-03-17 DIAGNOSIS — M5416 Radiculopathy, lumbar region: Secondary | ICD-10-CM | POA: Diagnosis not present

## 2024-03-17 DIAGNOSIS — Z791 Long term (current) use of non-steroidal anti-inflammatories (NSAID): Secondary | ICD-10-CM | POA: Diagnosis not present

## 2024-03-17 DIAGNOSIS — M4802 Spinal stenosis, cervical region: Secondary | ICD-10-CM | POA: Diagnosis not present

## 2024-03-21 DIAGNOSIS — M25551 Pain in right hip: Secondary | ICD-10-CM | POA: Diagnosis not present

## 2024-03-21 DIAGNOSIS — M6281 Muscle weakness (generalized): Secondary | ICD-10-CM | POA: Diagnosis not present

## 2024-03-21 DIAGNOSIS — Z7409 Other reduced mobility: Secondary | ICD-10-CM | POA: Diagnosis not present

## 2024-03-21 DIAGNOSIS — G8929 Other chronic pain: Secondary | ICD-10-CM | POA: Diagnosis not present

## 2024-03-21 DIAGNOSIS — M545 Low back pain, unspecified: Secondary | ICD-10-CM | POA: Diagnosis not present

## 2024-03-29 DIAGNOSIS — M25551 Pain in right hip: Secondary | ICD-10-CM | POA: Diagnosis not present

## 2024-03-29 DIAGNOSIS — M545 Low back pain, unspecified: Secondary | ICD-10-CM | POA: Diagnosis not present

## 2024-03-29 DIAGNOSIS — M6281 Muscle weakness (generalized): Secondary | ICD-10-CM | POA: Diagnosis not present

## 2024-03-29 DIAGNOSIS — G8929 Other chronic pain: Secondary | ICD-10-CM | POA: Diagnosis not present

## 2024-03-29 DIAGNOSIS — Z7409 Other reduced mobility: Secondary | ICD-10-CM | POA: Diagnosis not present

## 2024-04-04 DIAGNOSIS — G4733 Obstructive sleep apnea (adult) (pediatric): Secondary | ICD-10-CM | POA: Diagnosis not present

## 2024-04-04 DIAGNOSIS — R7303 Prediabetes: Secondary | ICD-10-CM | POA: Diagnosis not present

## 2024-04-04 DIAGNOSIS — M519 Unspecified thoracic, thoracolumbar and lumbosacral intervertebral disc disorder: Secondary | ICD-10-CM | POA: Diagnosis not present

## 2024-04-04 DIAGNOSIS — M810 Age-related osteoporosis without current pathological fracture: Secondary | ICD-10-CM | POA: Diagnosis not present

## 2024-04-04 DIAGNOSIS — N182 Chronic kidney disease, stage 2 (mild): Secondary | ICD-10-CM | POA: Diagnosis not present

## 2024-04-04 DIAGNOSIS — F331 Major depressive disorder, recurrent, moderate: Secondary | ICD-10-CM | POA: Diagnosis not present

## 2024-04-04 DIAGNOSIS — F0390 Unspecified dementia without behavioral disturbance: Secondary | ICD-10-CM | POA: Diagnosis not present

## 2024-04-04 DIAGNOSIS — E7800 Pure hypercholesterolemia, unspecified: Secondary | ICD-10-CM | POA: Diagnosis not present

## 2024-04-04 DIAGNOSIS — M797 Fibromyalgia: Secondary | ICD-10-CM | POA: Diagnosis not present

## 2024-04-04 DIAGNOSIS — I10 Essential (primary) hypertension: Secondary | ICD-10-CM | POA: Diagnosis not present

## 2024-04-04 DIAGNOSIS — I700 Atherosclerosis of aorta: Secondary | ICD-10-CM | POA: Diagnosis not present

## 2024-04-06 DIAGNOSIS — G8929 Other chronic pain: Secondary | ICD-10-CM | POA: Diagnosis not present

## 2024-04-06 DIAGNOSIS — M25551 Pain in right hip: Secondary | ICD-10-CM | POA: Diagnosis not present

## 2024-04-06 DIAGNOSIS — M6281 Muscle weakness (generalized): Secondary | ICD-10-CM | POA: Diagnosis not present

## 2024-04-06 DIAGNOSIS — M545 Low back pain, unspecified: Secondary | ICD-10-CM | POA: Diagnosis not present

## 2024-04-06 DIAGNOSIS — Z7409 Other reduced mobility: Secondary | ICD-10-CM | POA: Diagnosis not present

## 2024-04-10 DIAGNOSIS — M6281 Muscle weakness (generalized): Secondary | ICD-10-CM | POA: Diagnosis not present

## 2024-04-10 DIAGNOSIS — G8929 Other chronic pain: Secondary | ICD-10-CM | POA: Diagnosis not present

## 2024-04-10 DIAGNOSIS — Z7409 Other reduced mobility: Secondary | ICD-10-CM | POA: Diagnosis not present

## 2024-04-10 DIAGNOSIS — M25551 Pain in right hip: Secondary | ICD-10-CM | POA: Diagnosis not present

## 2024-04-10 DIAGNOSIS — M545 Low back pain, unspecified: Secondary | ICD-10-CM | POA: Diagnosis not present

## 2024-04-12 DIAGNOSIS — F609 Personality disorder, unspecified: Secondary | ICD-10-CM | POA: Diagnosis not present

## 2024-04-12 DIAGNOSIS — F411 Generalized anxiety disorder: Secondary | ICD-10-CM | POA: Diagnosis not present

## 2024-04-12 DIAGNOSIS — F29 Unspecified psychosis not due to a substance or known physiological condition: Secondary | ICD-10-CM | POA: Diagnosis not present

## 2024-04-12 DIAGNOSIS — F330 Major depressive disorder, recurrent, mild: Secondary | ICD-10-CM | POA: Diagnosis not present

## 2024-04-12 DIAGNOSIS — Z79899 Other long term (current) drug therapy: Secondary | ICD-10-CM | POA: Diagnosis not present

## 2024-04-12 DIAGNOSIS — R419 Unspecified symptoms and signs involving cognitive functions and awareness: Secondary | ICD-10-CM | POA: Diagnosis not present

## 2024-04-17 DIAGNOSIS — F32A Depression, unspecified: Secondary | ICD-10-CM | POA: Diagnosis not present

## 2024-04-17 DIAGNOSIS — F0390 Unspecified dementia without behavioral disturbance: Secondary | ICD-10-CM | POA: Diagnosis not present

## 2024-04-17 DIAGNOSIS — R41841 Cognitive communication deficit: Secondary | ICD-10-CM | POA: Diagnosis not present

## 2024-04-17 DIAGNOSIS — R2689 Other abnormalities of gait and mobility: Secondary | ICD-10-CM | POA: Diagnosis not present

## 2024-04-17 DIAGNOSIS — R269 Unspecified abnormalities of gait and mobility: Secondary | ICD-10-CM | POA: Diagnosis not present

## 2024-04-17 DIAGNOSIS — F411 Generalized anxiety disorder: Secondary | ICD-10-CM | POA: Diagnosis not present

## 2024-04-17 DIAGNOSIS — F603 Borderline personality disorder: Secondary | ICD-10-CM | POA: Diagnosis not present

## 2024-04-18 DIAGNOSIS — M545 Low back pain, unspecified: Secondary | ICD-10-CM | POA: Diagnosis not present

## 2024-04-18 DIAGNOSIS — G8929 Other chronic pain: Secondary | ICD-10-CM | POA: Diagnosis not present

## 2024-04-18 DIAGNOSIS — Z7409 Other reduced mobility: Secondary | ICD-10-CM | POA: Diagnosis not present

## 2024-04-18 DIAGNOSIS — M6281 Muscle weakness (generalized): Secondary | ICD-10-CM | POA: Diagnosis not present

## 2024-04-18 DIAGNOSIS — M25551 Pain in right hip: Secondary | ICD-10-CM | POA: Diagnosis not present

## 2024-05-17 DIAGNOSIS — Z791 Long term (current) use of non-steroidal anti-inflammatories (NSAID): Secondary | ICD-10-CM | POA: Diagnosis not present

## 2024-05-17 DIAGNOSIS — M47816 Spondylosis without myelopathy or radiculopathy, lumbar region: Secondary | ICD-10-CM | POA: Diagnosis not present

## 2024-06-02 DIAGNOSIS — N3941 Urge incontinence: Secondary | ICD-10-CM | POA: Diagnosis not present

## 2024-06-02 DIAGNOSIS — N3281 Overactive bladder: Secondary | ICD-10-CM | POA: Diagnosis not present

## 2024-06-04 DIAGNOSIS — U071 COVID-19: Secondary | ICD-10-CM | POA: Diagnosis not present

## 2024-07-05 DIAGNOSIS — L570 Actinic keratosis: Secondary | ICD-10-CM | POA: Diagnosis not present

## 2024-07-05 DIAGNOSIS — Z85828 Personal history of other malignant neoplasm of skin: Secondary | ICD-10-CM | POA: Diagnosis not present

## 2024-07-05 DIAGNOSIS — L821 Other seborrheic keratosis: Secondary | ICD-10-CM | POA: Diagnosis not present

## 2024-07-05 DIAGNOSIS — T162XXA Foreign body in left ear, initial encounter: Secondary | ICD-10-CM | POA: Diagnosis not present

## 2024-07-05 DIAGNOSIS — L812 Freckles: Secondary | ICD-10-CM | POA: Diagnosis not present

## 2024-07-05 DIAGNOSIS — H6122 Impacted cerumen, left ear: Secondary | ICD-10-CM | POA: Diagnosis not present

## 2024-07-05 DIAGNOSIS — D692 Other nonthrombocytopenic purpura: Secondary | ICD-10-CM | POA: Diagnosis not present

## 2024-07-07 DIAGNOSIS — F609 Personality disorder, unspecified: Secondary | ICD-10-CM | POA: Diagnosis not present

## 2024-07-07 DIAGNOSIS — Z79899 Other long term (current) drug therapy: Secondary | ICD-10-CM | POA: Diagnosis not present

## 2024-07-07 DIAGNOSIS — F330 Major depressive disorder, recurrent, mild: Secondary | ICD-10-CM | POA: Diagnosis not present

## 2024-07-07 DIAGNOSIS — R419 Unspecified symptoms and signs involving cognitive functions and awareness: Secondary | ICD-10-CM | POA: Diagnosis not present

## 2024-07-07 DIAGNOSIS — F411 Generalized anxiety disorder: Secondary | ICD-10-CM | POA: Diagnosis not present

## 2024-07-07 DIAGNOSIS — F29 Unspecified psychosis not due to a substance or known physiological condition: Secondary | ICD-10-CM | POA: Diagnosis not present

## 2024-08-09 ENCOUNTER — Other Ambulatory Visit (HOSPITAL_BASED_OUTPATIENT_CLINIC_OR_DEPARTMENT_OTHER): Payer: Self-pay | Admitting: Internal Medicine

## 2024-08-09 ENCOUNTER — Encounter (HOSPITAL_BASED_OUTPATIENT_CLINIC_OR_DEPARTMENT_OTHER): Payer: Self-pay | Admitting: Internal Medicine

## 2024-08-09 DIAGNOSIS — Z1331 Encounter for screening for depression: Secondary | ICD-10-CM | POA: Diagnosis not present

## 2024-08-09 DIAGNOSIS — M519 Unspecified thoracic, thoracolumbar and lumbosacral intervertebral disc disorder: Secondary | ICD-10-CM | POA: Diagnosis not present

## 2024-08-09 DIAGNOSIS — I700 Atherosclerosis of aorta: Secondary | ICD-10-CM | POA: Diagnosis not present

## 2024-08-09 DIAGNOSIS — F0390 Unspecified dementia without behavioral disturbance: Secondary | ICD-10-CM | POA: Diagnosis not present

## 2024-08-09 DIAGNOSIS — Z23 Encounter for immunization: Secondary | ICD-10-CM | POA: Diagnosis not present

## 2024-08-09 DIAGNOSIS — M810 Age-related osteoporosis without current pathological fracture: Secondary | ICD-10-CM | POA: Diagnosis not present

## 2024-08-09 DIAGNOSIS — R7303 Prediabetes: Secondary | ICD-10-CM | POA: Diagnosis not present

## 2024-08-09 DIAGNOSIS — E7800 Pure hypercholesterolemia, unspecified: Secondary | ICD-10-CM | POA: Diagnosis not present

## 2024-08-09 DIAGNOSIS — Z0000 Encounter for general adult medical examination without abnormal findings: Secondary | ICD-10-CM | POA: Diagnosis not present

## 2024-08-09 DIAGNOSIS — I10 Essential (primary) hypertension: Secondary | ICD-10-CM | POA: Diagnosis not present

## 2024-08-09 DIAGNOSIS — N182 Chronic kidney disease, stage 2 (mild): Secondary | ICD-10-CM | POA: Diagnosis not present

## 2024-08-09 DIAGNOSIS — G629 Polyneuropathy, unspecified: Secondary | ICD-10-CM | POA: Diagnosis not present

## 2024-08-27 DIAGNOSIS — S2231XA Fracture of one rib, right side, initial encounter for closed fracture: Secondary | ICD-10-CM | POA: Diagnosis not present

## 2024-08-27 DIAGNOSIS — X58XXXA Exposure to other specified factors, initial encounter: Secondary | ICD-10-CM | POA: Diagnosis not present

## 2024-08-27 DIAGNOSIS — R0781 Pleurodynia: Secondary | ICD-10-CM | POA: Diagnosis not present

## 2024-08-27 DIAGNOSIS — R0782 Intercostal pain: Secondary | ICD-10-CM | POA: Diagnosis not present

## 2024-09-26 DIAGNOSIS — B372 Candidiasis of skin and nail: Secondary | ICD-10-CM | POA: Diagnosis not present

## 2024-09-26 DIAGNOSIS — N941 Unspecified dyspareunia: Secondary | ICD-10-CM | POA: Diagnosis not present

## 2024-09-26 DIAGNOSIS — A6000 Herpesviral infection of urogenital system, unspecified: Secondary | ICD-10-CM | POA: Diagnosis not present

## 2024-09-29 DIAGNOSIS — F411 Generalized anxiety disorder: Secondary | ICD-10-CM | POA: Diagnosis not present

## 2024-09-29 DIAGNOSIS — F330 Major depressive disorder, recurrent, mild: Secondary | ICD-10-CM | POA: Diagnosis not present

## 2024-09-29 DIAGNOSIS — F29 Unspecified psychosis not due to a substance or known physiological condition: Secondary | ICD-10-CM | POA: Diagnosis not present

## 2024-10-02 ENCOUNTER — Other Ambulatory Visit: Payer: Self-pay | Admitting: Internal Medicine

## 2024-10-02 DIAGNOSIS — Z1231 Encounter for screening mammogram for malignant neoplasm of breast: Secondary | ICD-10-CM

## 2024-10-05 ENCOUNTER — Ambulatory Visit

## 2024-10-05 DIAGNOSIS — Z1231 Encounter for screening mammogram for malignant neoplasm of breast: Secondary | ICD-10-CM

## 2024-10-11 DIAGNOSIS — Z01419 Encounter for gynecological examination (general) (routine) without abnormal findings: Secondary | ICD-10-CM | POA: Diagnosis not present

## 2024-10-27 DIAGNOSIS — R350 Frequency of micturition: Secondary | ICD-10-CM | POA: Diagnosis not present

## 2024-10-27 DIAGNOSIS — N3281 Overactive bladder: Secondary | ICD-10-CM | POA: Diagnosis not present

## 2024-10-27 DIAGNOSIS — R3915 Urgency of urination: Secondary | ICD-10-CM | POA: Diagnosis not present

## 2024-11-02 DIAGNOSIS — G4733 Obstructive sleep apnea (adult) (pediatric): Secondary | ICD-10-CM | POA: Diagnosis not present

## 2024-11-10 ENCOUNTER — Ambulatory Visit

## 2024-11-10 DIAGNOSIS — M85852 Other specified disorders of bone density and structure, left thigh: Secondary | ICD-10-CM | POA: Diagnosis not present

## 2024-11-10 DIAGNOSIS — M810 Age-related osteoporosis without current pathological fracture: Secondary | ICD-10-CM | POA: Diagnosis not present

## 2024-11-10 DIAGNOSIS — Z780 Asymptomatic menopausal state: Secondary | ICD-10-CM | POA: Diagnosis not present

## 2024-11-10 DIAGNOSIS — M85851 Other specified disorders of bone density and structure, right thigh: Secondary | ICD-10-CM | POA: Diagnosis not present
